# Patient Record
Sex: Male | Born: 1965 | Race: Asian | Hispanic: No | Marital: Married | Smoking: Never smoker
Health system: Southern US, Community
[De-identification: ages and names within clinical notes are randomized; demographics above are authoritative.]

## PROBLEM LIST (undated history)

## (undated) DIAGNOSIS — Z87442 Personal history of urinary calculi: Secondary | ICD-10-CM

## (undated) DIAGNOSIS — I10 Essential (primary) hypertension: Secondary | ICD-10-CM

## (undated) HISTORY — PX: VASECTOMY: SHX75

## (undated) HISTORY — DX: Essential (primary) hypertension: I10

## (undated) HISTORY — DX: Personal history of urinary calculi: Z87.442

---

## 2000-01-18 ENCOUNTER — Other Ambulatory Visit: Admission: RE | Admit: 2000-01-18 | Discharge: 2000-01-18 | Payer: Self-pay | Admitting: Urology

## 2001-02-28 ENCOUNTER — Encounter: Payer: Self-pay | Admitting: Emergency Medicine

## 2001-02-28 ENCOUNTER — Emergency Department (HOSPITAL_COMMUNITY): Admission: EM | Admit: 2001-02-28 | Discharge: 2001-03-01 | Payer: Self-pay | Admitting: Emergency Medicine

## 2006-03-13 ENCOUNTER — Ambulatory Visit: Payer: Self-pay | Admitting: Internal Medicine

## 2008-12-20 ENCOUNTER — Emergency Department (HOSPITAL_COMMUNITY): Admission: EM | Admit: 2008-12-20 | Discharge: 2008-12-21 | Payer: Self-pay | Admitting: Emergency Medicine

## 2009-08-06 IMAGING — CT CT PELVIS W/O CM
2 of 4 series · 17 of 46 positions shown, 19 images · non-contrast
Comparison: None available.

CT ABDOMEN

CLINICAL DATA: Left flank pain and hematuria.

CT ABDOMEN AND PELVIS WITHOUT CONTRAST
TECHNIQUE: Multidetector CT imaging of the abdomen and pelvis was
performed following the standard protocol without intravenous
contrast.

[Series 2: renal stone 5.0 b31f st · axial · 0.59mm/px · z∈[+684,+1064]mm · 14 of 84 slices shown, 16 images]
[im 4/84  soft-tissue]
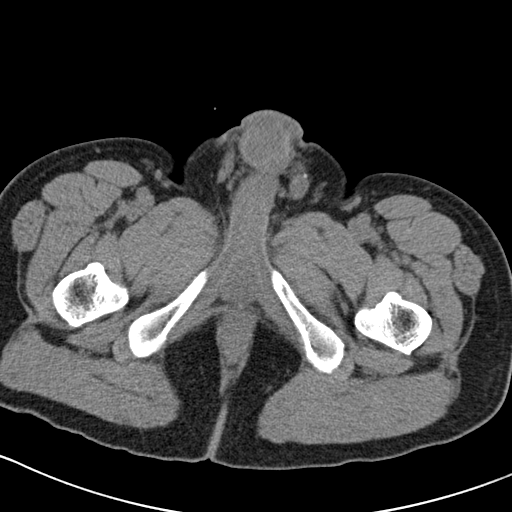
[im 4/84  bone]
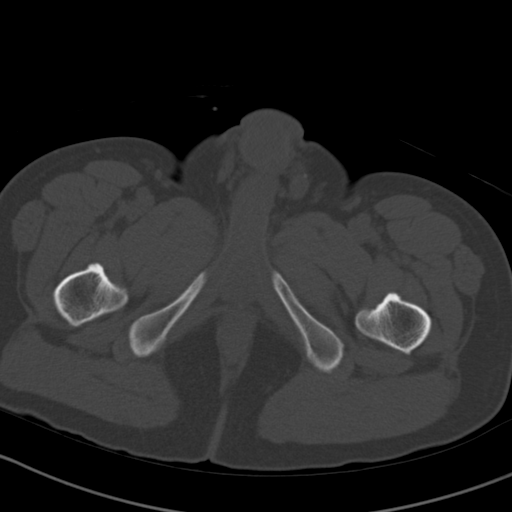
[im 11/84  soft-tissue]
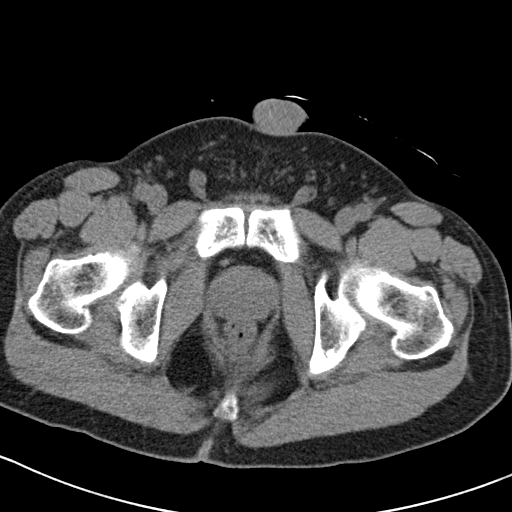
[im 18/84  soft-tissue]
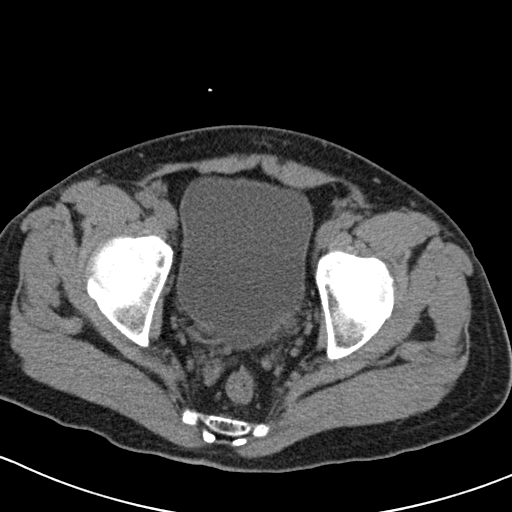
[im 21/84  soft-tissue]
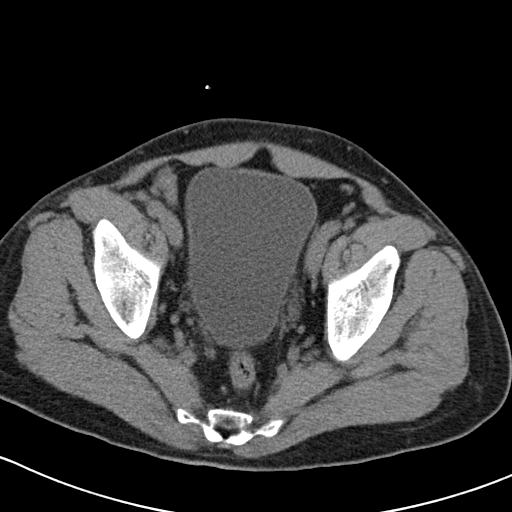
[im 28/84  soft-tissue]
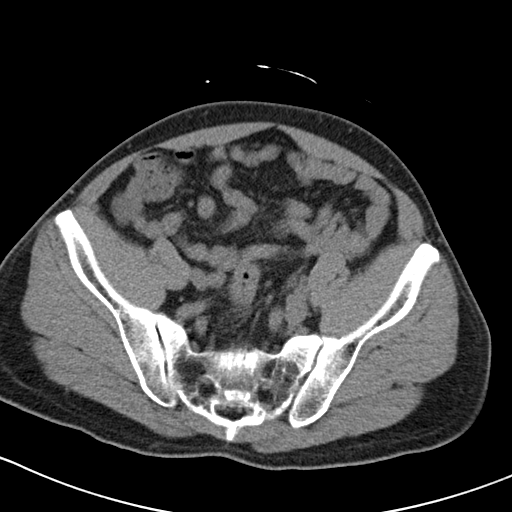
[im 35/84  soft-tissue]
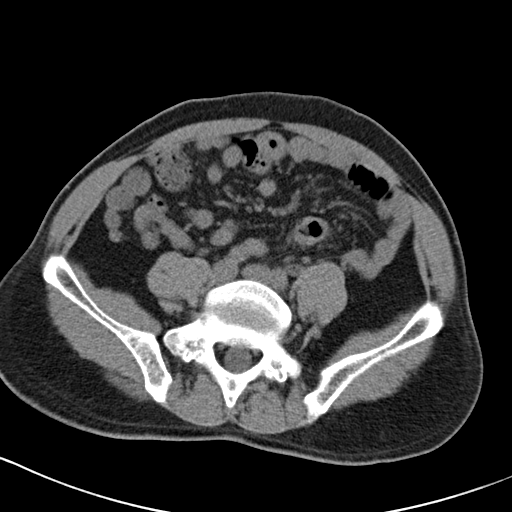
[im 39/84  soft-tissue]
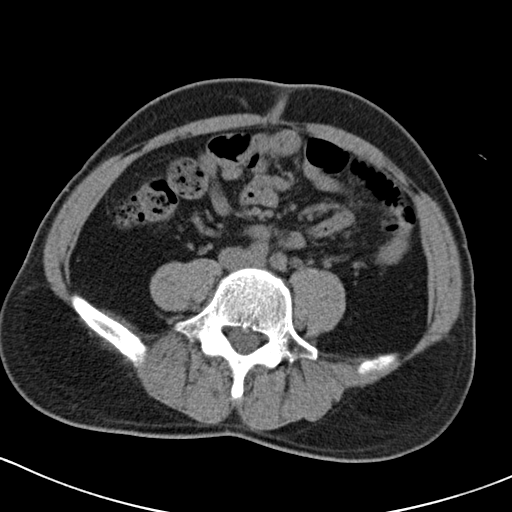
[im 45/84  soft-tissue]
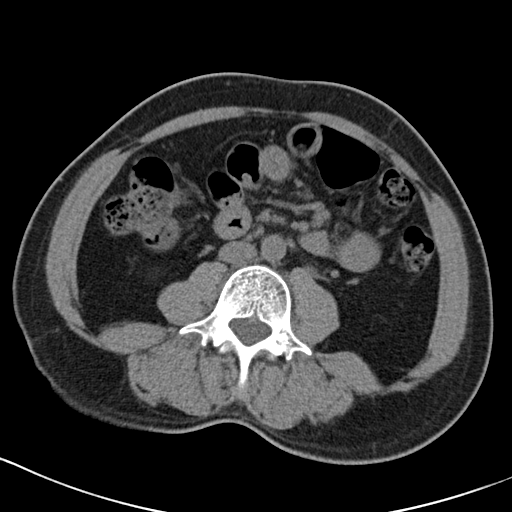
[im 49/84  soft-tissue]
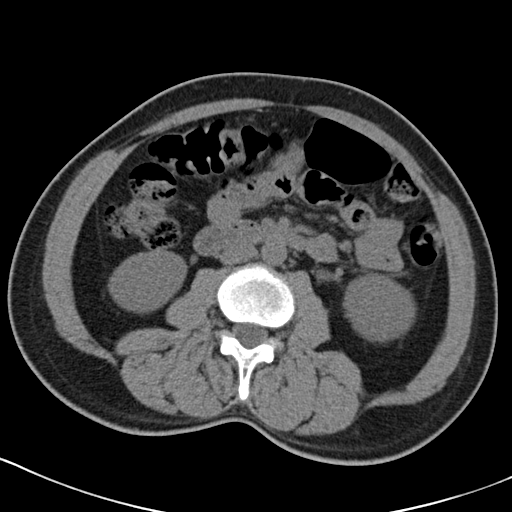
[im 49/84  bone]
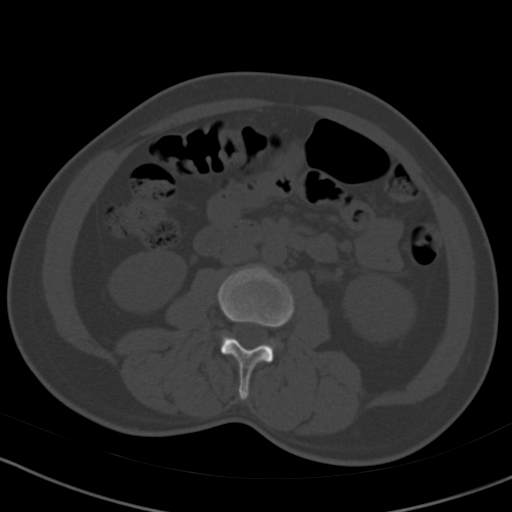
[im 56/84  soft-tissue]
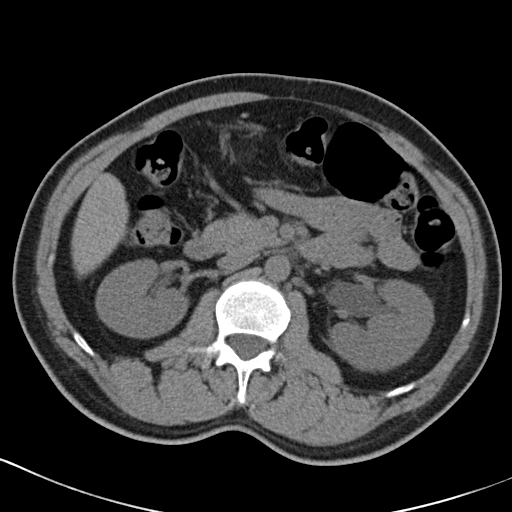
[im 63/84  soft-tissue]
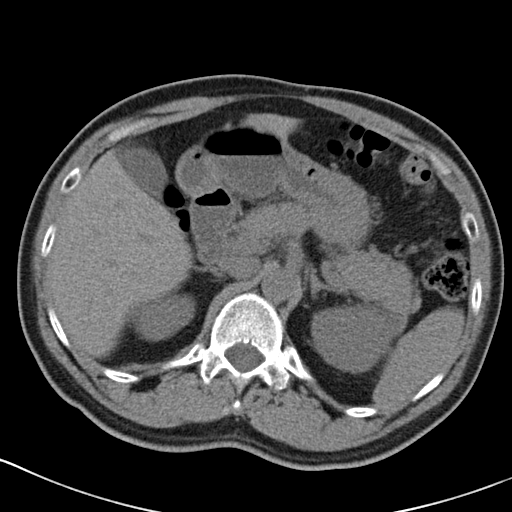
[im 66/84  soft-tissue]
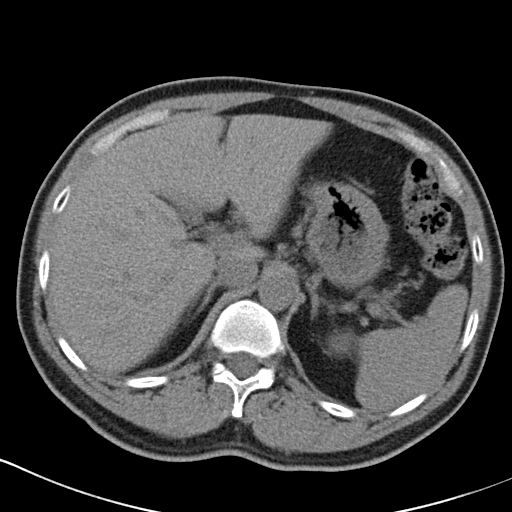
[im 73/84  soft-tissue]
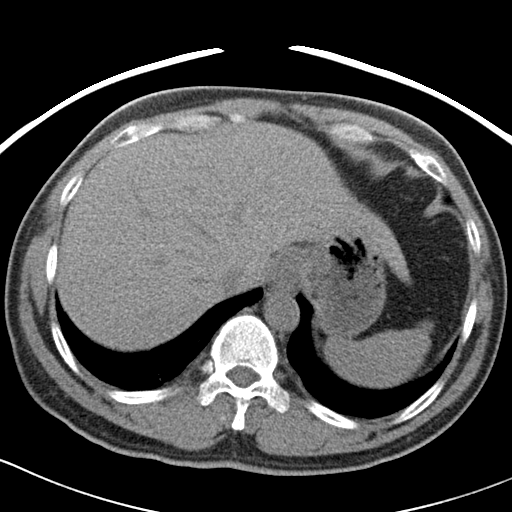
[im 80/84  soft-tissue]
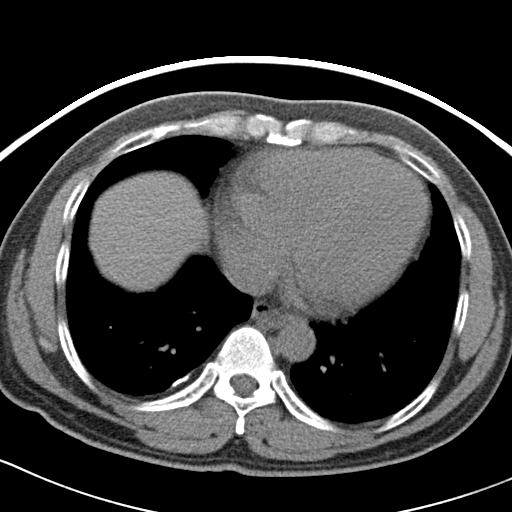

[Series 6: renal stone 2.0 spo st · coronal · 0.83mm/px · 3 of 111 slices shown]
[im 37/111  soft-tissue]
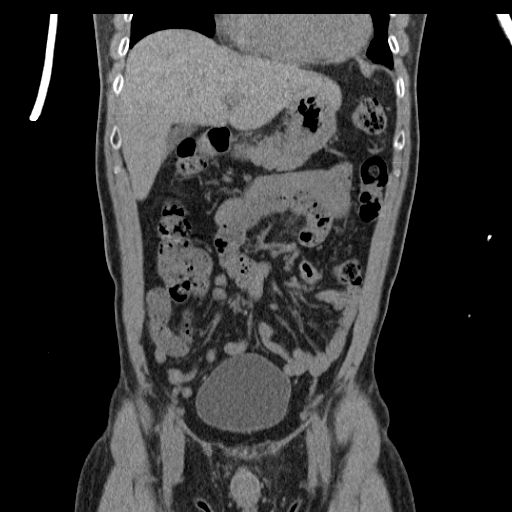
[im 49/111  soft-tissue]
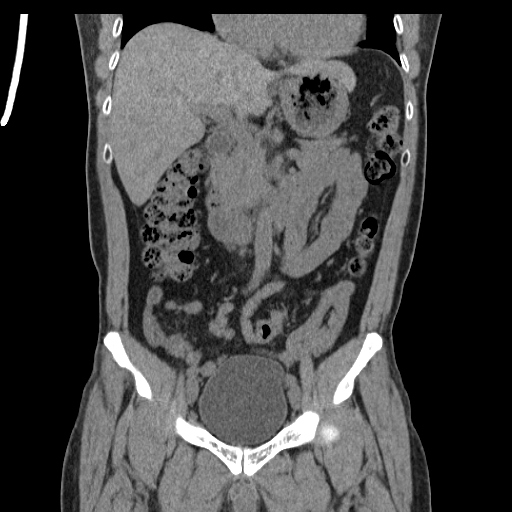
[im 62/111  soft-tissue]
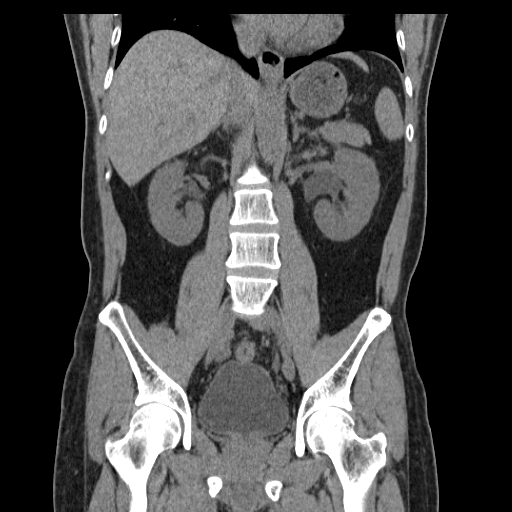

[17 of 46 positions shown; findings below may reference images not displayed]

FINDINGS: Calcified pleural plaques are seen posteriorly on both
the right and left.  There is mild dependent atelectatic change in
the lung bases. The patient has a small hiatal hernia.  Heart size
is mildly enlarged.  No pleural or pericardial effusion.

There is stranding about the left kidney and ureter and moderate
left hydronephrosis due to a 0.3 cm stone at the left
ureterovesicle junction.  A 0.2 cm nonobstructing stone is
identified the lower pole of the left kidney.  There are no right
renal or ureteral stones and the kidneys are otherwise
unremarkable.

The liver, gallbladder, spleen, pancreas and adrenal glands all
appear normal.  The stomach and small bowel appear normal. There is
no abdominal lymphadenopathy or fluid.  No focal bony abnormality.
IMPRESSION: 1.  Moderate left hydronephrosis due to 0.3 cm distal left ureteral
stone.  0.2 cm nonobstructing stone lower pole left kidney also
noted.
2.  Calcified pleural plaques compatible with prior asbestos
exposure.
3.  Small hiatal hernia.
4.  Mild cardiomegaly.

CT PELVIS
FINDINGS: There is no pelvic fluid or lymphadenopathy.  Seminal
vesicles and prostate gland appear normal.  The colon has a normal
CT appearance.  The appendix is visualized and unremarkable.  No
focal bony abnormality.
IMPRESSION: 0.3 cm left ureteral vesicle stone is again noted.  The pelvis is
otherwise negative.

## 2010-06-21 ENCOUNTER — Ambulatory Visit: Payer: Self-pay | Admitting: Internal Medicine

## 2010-06-21 DIAGNOSIS — Z87442 Personal history of urinary calculi: Secondary | ICD-10-CM

## 2010-10-08 ENCOUNTER — Telehealth: Payer: Self-pay | Admitting: Family Medicine

## 2010-10-08 ENCOUNTER — Ambulatory Visit: Payer: Self-pay | Admitting: Family Medicine

## 2010-10-08 DIAGNOSIS — I10 Essential (primary) hypertension: Secondary | ICD-10-CM

## 2010-10-08 DIAGNOSIS — R29810 Facial weakness: Secondary | ICD-10-CM | POA: Insufficient documentation

## 2010-10-08 LAB — CONVERTED CEMR LAB
ALT: 40 units/L (ref 0–53)
AST: 32 units/L (ref 0–37)
Albumin: 4.8 g/dL (ref 3.5–5.2)
Alkaline Phosphatase: 59 units/L (ref 39–117)
BUN: 20 mg/dL (ref 6–23)
Basophils Absolute: 0 10*3/uL (ref 0.0–0.1)
Basophils Relative: 0.8 % (ref 0.0–3.0)
Bilirubin, Direct: 0.1 mg/dL (ref 0.0–0.3)
CO2: 28 meq/L (ref 19–32)
Calcium: 9.1 mg/dL (ref 8.4–10.5)
Chloride: 102 meq/L (ref 96–112)
Creatinine, Ser: 0.8 mg/dL (ref 0.4–1.5)
Eosinophils Absolute: 0.2 10*3/uL (ref 0.0–0.7)
Eosinophils Relative: 3.1 % (ref 0.0–5.0)
GFR calc non Af Amer: 105.72 mL/min (ref >60)
Glucose, Bld: 98 mg/dL (ref 70–99)
HCT: 43.2 % (ref 39.0–52.0)
Hemoglobin: 14.7 g/dL (ref 13.0–17.0)
INR: 0.9 (ref 0.8–1.0)
Lymphocytes Relative: 28.3 % (ref 12.0–46.0)
Lymphs Abs: 1.7 10*3/uL (ref 0.7–4.0)
MCHC: 34 g/dL (ref 30.0–36.0)
MCV: 85.2 fL (ref 78.0–100.0)
Monocytes Absolute: 0.4 10*3/uL (ref 0.1–1.0)
Monocytes Relative: 6.1 % (ref 3.0–12.0)
Neutro Abs: 3.7 10*3/uL (ref 1.4–7.7)
Neutrophils Relative %: 61.7 % (ref 43.0–77.0)
Platelets: 252 10*3/uL (ref 150.0–400.0)
Potassium: 4.5 meq/L (ref 3.5–5.1)
Prothrombin Time: 9.9 s (ref 9.7–11.8)
RBC: 5.07 M/uL (ref 4.22–5.81)
RDW: 13.2 % (ref 11.5–14.6)
Sodium: 138 meq/L (ref 135–145)
Total Bilirubin: 0.8 mg/dL (ref 0.3–1.2)
Total Protein: 7.4 g/dL (ref 6.0–8.3)
WBC: 6.1 10*3/uL (ref 4.5–10.5)
aPTT: 27.9 s (ref 21.7–28.8)

## 2010-10-09 ENCOUNTER — Ambulatory Visit: Payer: Self-pay | Admitting: Cardiovascular Disease

## 2010-10-09 ENCOUNTER — Encounter: Payer: Self-pay | Admitting: Family Medicine

## 2010-10-10 ENCOUNTER — Encounter: Payer: Self-pay | Admitting: Family Medicine

## 2010-10-10 ENCOUNTER — Telehealth: Payer: Self-pay | Admitting: Family Medicine

## 2010-10-11 ENCOUNTER — Telehealth: Payer: Self-pay | Admitting: Family Medicine

## 2010-10-15 ENCOUNTER — Ambulatory Visit: Payer: Self-pay | Admitting: Internal Medicine

## 2010-12-23 LAB — CONVERTED CEMR LAB
ALT: 22 units/L (ref 0–53)
AST: 22 units/L (ref 0–37)
Albumin: 4.8 g/dL (ref 3.5–5.2)
Alkaline Phosphatase: 60 units/L (ref 39–117)
BUN: 15 mg/dL (ref 6–23)
Basophils Absolute: 0 10*3/uL (ref 0.0–0.1)
Basophils Relative: 0.8 % (ref 0.0–3.0)
Bilirubin Urine: NEGATIVE
Bilirubin, Direct: 0.1 mg/dL (ref 0.0–0.3)
CO2: 28 meq/L (ref 19–32)
Calcium: 8.7 mg/dL (ref 8.4–10.5)
Chloride: 101 meq/L (ref 96–112)
Cholesterol: 228 mg/dL — ABNORMAL HIGH (ref 0–200)
Creatinine, Ser: 0.8 mg/dL (ref 0.4–1.5)
Direct LDL: 154.7 mg/dL
Eosinophils Absolute: 0.1 10*3/uL (ref 0.0–0.7)
Eosinophils Relative: 2.7 % (ref 0.0–5.0)
GFR calc non Af Amer: 110.45 mL/min (ref >60)
Glucose, Bld: 86 mg/dL (ref 70–99)
Glucose, Urine, Semiquant: NEGATIVE
HCT: 43.6 % (ref 39.0–52.0)
HDL: 55.5 mg/dL (ref >39.00)
Hemoglobin: 14.8 g/dL (ref 13.0–17.0)
Ketones, urine, test strip: NEGATIVE
Lymphocytes Relative: 33.4 % (ref 12.0–46.0)
Lymphs Abs: 1.8 10*3/uL (ref 0.7–4.0)
MCHC: 33.9 g/dL (ref 30.0–36.0)
MCV: 86.2 fL (ref 78.0–100.0)
Monocytes Absolute: 0.4 10*3/uL (ref 0.1–1.0)
Monocytes Relative: 7.2 % (ref 3.0–12.0)
Neutro Abs: 3 10*3/uL (ref 1.4–7.7)
Neutrophils Relative %: 55.9 % (ref 43.0–77.0)
Nitrite: NEGATIVE
Platelets: 278 10*3/uL (ref 150.0–400.0)
Potassium: 3.9 meq/L (ref 3.5–5.1)
RBC: 5.05 M/uL (ref 4.22–5.81)
RDW: 12.7 % (ref 11.5–14.6)
Sodium: 142 meq/L (ref 135–145)
Specific Gravity, Urine: 1.02
TSH: 0.79 microintl units/mL (ref 0.35–5.50)
Total Bilirubin: 1.1 mg/dL (ref 0.3–1.2)
Total CHOL/HDL Ratio: 4
Total Protein: 7.3 g/dL (ref 6.0–8.3)
Triglycerides: 112 mg/dL (ref 0.0–149.0)
Urobilinogen, UA: 1
VLDL: 22.4 mg/dL (ref 0.0–40.0)
WBC Urine, dipstick: NEGATIVE
WBC: 5.4 10*3/uL (ref 4.5–10.5)
pH: 7

## 2010-12-25 NOTE — Assessment & Plan Note (Signed)
Summary: 1 WK ROV/NJR   Vital Signs:  Patient profile:   45 year old male Weight:      129 pounds Temp:     99.0 degrees F oral Pulse rate:   72 / minute BP sitting:   136 / 94  (left arm) Cuff size:   regular  Vitals Entered By: Alfred Levins, CMA (October 15, 2010 11:51 AM)  Serial Vital Signs/Assessments:  Time      Position  BP       Pulse  Resp  Temp     By                     130/88                         Birdie Sons MD  CC: f/u on bp   Primary Care Provider:  Birdie Sons MD  CC:  f/u on bp.  History of Present Illness: reviewed recent notes. Patient is here for follow. He denies any chest pain, shortness breath, PND. He denies any new or consistent neurologic deficits. Previous paresthesias or results. Patient remains active.  Patient denies any other complaints.  Current Problems (verified): 1)  Disturbance of Skin Sensation  (ICD-782.0) 2)  Facial Weakness  (ICD-781.94) 3)  Essential Hypertension, Benign  (ICD-401.1) 4)  Preventive Health Care  (ICD-V70.0) 5)  Nephrolithiasis, Hx of  (ICD-V13.01) 6)  Renal Calculus, Hx of  (ICD-V13.01)  Current Medications (verified): 1)  Lisinopril 10 Mg Tabs (Lisinopril) .Marland Kitchen.. 1 Tab By Mouth Qd  Allergies (verified): No Known Drug Allergies  Physical Exam  General:  well-developed well-nourished male in no acute distress. HEENT exam atraumatic, normocephalic, neck supple. Chest clear to auscultation. Cardiac exam S1-S2 are regular. Abdominal exam active bowel sounds, soft extremities no clubbing cyanosis or edema.   Impression & Recommendations:  Problem # 1:  DISTURBANCE OF SKIN SENSATION (ICD-782.0) resolving I don't think any further eval necessary IMPRESSION:   Normal CT of the head without contrast.    Read By:  Camelia Phenes,  M.D.   Released By:  Camelia Phenes,  M.D.  Additional Information  HL7 RESULT STATUS : F  External image : [id number]  External IF Update Timestamp :  2010-10-09:16:11:31.000000  Problem # 3:  ESSENTIAL HYPERTENSION, BENIGN (ICD-401.1)  His updated medication list for this problem includes:    Lisinopril 10 Mg Tabs (Lisinopril) .Marland Kitchen... 1 tab by mouth qd  BP today: 136/94 Prior BP: 140/100 (10/08/2010)  Labs Reviewed: K+: 4.5 (10/08/2010) Creat: : 0.8 (10/08/2010)   Chol: 228 (06/21/2010)   HDL: 55.50 (06/21/2010)   TG: 112.0 (06/21/2010)  Complete Medication List: 1)  Lisinopril 10 Mg Tabs (Lisinopril) .Marland Kitchen.. 1 tab by mouth qd  Patient Instructions: 1)  Please schedule a follow-up appointment in 3 months.   Orders Added: 1)  Est. Patient Level III [91478]

## 2010-12-25 NOTE — Assessment & Plan Note (Signed)
Summary: new pt/cpx/pt will come in fasting/njr   Vital Signs:  Patient profile:   45 year old male Height:      61.5 inches Weight:      127 pounds BMI:     23.69 Temp:     98.3 degrees F oral BP sitting:   108 / 80  (left arm)  Vitals Entered By: Kern Reap CMA Duncan Dull) (June 21, 2010 8:35 AM) CC: new to establish Is Patient Diabetic? No Pain Assessment Patient in pain? no        CC:  new to establish.  History of Present Illness: cpx  Preventive Screening-Counseling & Management  Alcohol-Tobacco     Smoking Status: never  Caffeine-Diet-Exercise     Caffeine use/day: 1     Does Patient Exercise: yes  Hep-HIV-STD-Contraception     Dental Visit-last 6 months yes  Safety-Violence-Falls     Seat Belt Use: yes      Drug Use:  no.    Current Problems (verified): 1)  Renal Calculus, Hx of  (ICD-V13.01)  Current Medications (verified): 1)  None  Allergies (verified): No Known Drug Allergies  Past History:  Past Medical History: Nephrolithiasis, hx of  Past Surgical History:  Vasectomy  Family History: Father: stroke age 64 yo Mother: asthma murdered Siblings: 1 sister - deceased murdered  Social History: Occupation: Geneticist, molecular (night shift) Married Never Smoked Alcohol use-yes Drug use-no Regular exercise-yes 3-4 times weekly 2 kids---healthy Does Patient Exercise:  yes Dental Care w/in 6 mos.:  yes Seat Belt Use:  yes Alcohol:  Less than 3 drinks per week Caffeine use/day:  1 Smoking Status:  never Drug Use:  no  Physical Exam  Head:  normocephalic and atraumatic.   Eyes:  pupils equal and pupils round.   Ears:  R ear normal and L ear normal.   Neck:  No deformities, masses, or tenderness noted. Chest Wall:  No deformities, masses, tenderness or gynecomastia noted. Lungs:  Normal respiratory effort, chest expands symmetrically. Lungs are clear to auscultation, no crackles or wheezes. Heart:  normal rate and regular rhythm.     Abdomen:  soft and non-tender.   Msk:  No deformity or scoliosis noted of thoracic or lumbar spine.   Pulses:  R radial normal and L radial normal.   Neurologic:  cranial nerves II-XII intact and gait normal.   Skin:  turgor normal and color normal.   Psych:  good eye contact and not anxious appearing.     Impression & Recommendations:  Problem # 1:  PREVENTIVE HEALTH CARE (ICD-V70.0) health maint UTD encouraged continued excellent helath habits Orders: EKG w/ Interpretation (93000) UA Dipstick w/o Micro (automated)  (81003) Venipuncture (25366) Specimen Handling (44034) TLB-Lipid Panel (80061-LIPID) TLB-BMP (Basic Metabolic Panel-BMET) (80048-METABOL) TLB-CBC Platelet - w/Differential (85025-CBCD) TLB-Hepatic/Liver Function Pnl (80076-HEPATIC) TLB-TSH (Thyroid Stimulating Hormone) (84443-TSH)  Laboratory Results   Urine Tests    Routine Urinalysis   Color: yellow Appearance: Clear Glucose: negative   (Normal Range: Negative) Bilirubin: negative   (Normal Range: Negative) Ketone: negative   (Normal Range: Negative) Spec. Gravity: 1.020   (Normal Range: 1.003-1.035) Blood: trace-intact   (Normal Range: Negative) pH: 7.0   (Normal Range: 5.0-8.0) Protein: 1+   (Normal Range: Negative) Urobilinogen: 1.0   (Normal Range: 0-1) Nitrite: negative   (Normal Range: Negative) Leukocyte Esterace: negative   (Normal Range: Negative)    Comments: Rita Ohara  June 21, 2010 10:51 AM

## 2010-12-25 NOTE — Assessment & Plan Note (Signed)
Summary: face numbness/bp elev/njr   Vital Signs:  Patient profile:   45 year old male Height:      61.5 inches (156.21 cm) Weight:      132 pounds (60.00 kg) O2 Sat:      99 % on Room air Temp:     98.4 degrees F (36.89 degrees C) oral Pulse rate:   64 / minute BP sitting:   140 / 100  (left arm) Cuff size:   regular  Vitals Entered By: Josph Macho RMA (October 08, 2010 9:12 AM)  O2 Flow:  Room air CC: Face numbness X6 days/ Elevated BP X2 days/ CF Is Patient Diabetic? No   History of Present Illness: 45 y/o WM with about 6d history of vague numbness sensation in right forehead area.  Hurts to raise right eyebrow, sometimes feels weaker when trying to raise right eyebrow  (?? pt not sure about this?).  He denies any droop of his face, any persistent weakness of face, dysphagia, dysarthria, or vision or hearing problems.   He denies extremity weakness or paresthesias.  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  No rash or other skin changes noted. He has no hx of elevated BP. Denies CP, SOB, palpitations, leg pains, leg swelling, fever, or malaise.  PMH: nephrolithiasis, most recently in 2010. PSH: none   Preventive Screening-Counseling & Management  Alcohol-Tobacco     Smoking Status: never  Current Medications (verified): 1)  Lisinopril 10 Mg Tabs (Lisinopril) .Marland Kitchen.. 1 Tab By Mouth Qd  Allergies (verified): No Known Drug Allergies  Past History:  Past medical, surgical, family and social histories (including risk factors) reviewed, and no changes noted (except as noted below).  Past Medical History: Reviewed history from 06/21/2010 and no changes required. Nephrolithiasis, hx of  Past Surgical History: Reviewed history from 06/21/2010 and no changes required.  Vasectomy  Review of Systems       see HPI.  Physical Exam  General:  VS:all noted: P 65-75 BP 140/100     Gen: Alert, well appearing, oriented x 4. HEENT: Scalp without lesions or hair loss.  Ears: EACs clear, normal epithelium.  TMs with good light reflex and landmarks bilaterally.  Eyes: no injection, icteris, swelling, or exudate.  EOMI, PERRLA. Nose: no drainage or turbinate edema/swelling.  No inection or focal lesion.  Mouth: lips without lesion/swelling.  Oral mucosa pink and moist.  Dentition intact and without obvious caries or gingival swelling.  Oropharynx without erythema, exudate, or swelling.  Neck: supple.  No lymphadenopathy, thyromegaly, or mass. Chest: symmetric expansion, with nonlabored respirations.  Clear and equal breath sounds in all lung fields.   CV: RRR, no m/r/g.  Peripheral pulses 2+/symmetric. EXT: no clubbing, cyanosis, or edema.  Neuro:  decreased fine touch sensation in right forehead area extending into anterior 1/2 of the scalp.  No skin changes here.  Otherwise CN 2-12 grossly intact bilaterally.  PERRLA. Strength 5/5 prox and dist in UE's and LE's bilat. No tremor, no disdiadokinesis, no ataxia.  DTRs 1+, symmetric in triceps, biceps, patellar, and achilles areas bilaterally.     Impression & Recommendations:  Problem # 1:  DISTURBANCE OF SKIN SENSATION (ICD-782.0) This most clearly fits the distribution of the opthalmic branch of the trigeminal nerve, and would expect this to be part of varicella zoster syndrome.  However, with pt unsure about intermittent eyebrow weakness PLUS no sign of  any rash at this time, I am not completely certain that a central CNS lesion is not present.  Will go ahead and obtain noncontrast head CT ASAP. Additionally, will check CBC, CMET, coag panel.  Problem # 2:  ESSENTIAL HYPERTENSION, BENIGN (ICD-401.1) Given the height of his bp measurements at work last night and current elevation today, I feel he needs to be started on antihypertensive now so will start lisinopril 10mg  once daily.   Recheck 1 wk.  Orders: Venipuncture  (16109) Specimen Handling (60454) TLB-BMP (Basic Metabolic Panel-BMET) (80048-METABOL) TLB-CBC Platelet - w/Differential (85025-CBCD) TLB-Hepatic/Liver Function Pnl (80076-HEPATIC) Radiology Referral (Radiology)  Complete Medication List: 1)  Lisinopril 10 Mg Tabs (Lisinopril) .Marland Kitchen.. 1 tab by mouth qd  Other Orders: TLB-PT (Protime) (85610-PTP) TLB-PTT (85730-PTTL)  Patient Instructions: 1)  Please schedule a follow-up appointment in 1 week.  Prescriptions: LISINOPRIL 10 MG TABS (LISINOPRIL) 1 tab by mouth qd  #30 x 0   Entered and Authorized by:   Michell Heinrich M.D.   Signed by:   Michell Heinrich M.D. on 10/08/2010   Method used:   Print then Give to Patient   RxID:   [id number]    Orders Added: 1)  Est. Patient Level IV [30865] 2)  Venipuncture [78469] 3)  Specimen Handling [99000] 4)  TLB-BMP (Basic Metabolic Panel-BMET) [80048-METABOL] 5)  TLB-CBC Platelet - w/Differential [85025-CBCD] 6)  TLB-Hepatic/Liver Function Pnl [80076-HEPATIC] 7)  TLB-PT (Protime) [85610-PTP] 8)  TLB-PTT [85730-PTTL] 9)  Radiology Referral [Radiology]

## 2010-12-25 NOTE — Letter (Signed)
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         Sincerely,    Josph Macho RMA

## 2010-12-25 NOTE — Progress Notes (Signed)
Summary: CT resuts?  Phone Note Call from Patient Call back at Home Phone [phone]   Caller: Patient Call For: Dr. Milinda Cave Summary of Call: Pt is calling for CT results. Advised that I dont' see it back in the chart yet.  Initial call taken by: [facility] CMA AAMA,  October 10, 2010 3:59 PM  Follow-up for Phone Call        I don't see it either.  Should be back any time, then we'll let him know. Follow-up by: Michell Heinrich M.D.,  October 11, 2010 8:13 AM

## 2010-12-25 NOTE — Progress Notes (Signed)
Summary: Work excuse?  Phone Note Other Incoming   Summary of Call: Please call pt and tell him all his blood tests came back normal. Initial call taken by: Michell Heinrich M.D.,  October 08, 2010 2:28 PM  Follow-up for Phone Call        I left a message for pt to return my call. Follow-up by: Josph Macho RMA,  October 08, 2010 2:32 PM  Additional Follow-up for Phone Call Additional follow up Details #1::        pt is rt christy call [phone] Additional Follow-up by: Heron Sabins,  October 08, 2010 3:55 PM    Additional Follow-up for Phone Call Additional follow up Details #2::    Patient informed. Pt would like to know if he can have a work excuse for [employer] and tomorrow night. Follow-up by: Josph Macho RMA,  October 08, 2010 4:03 PM  Additional Follow-up for Phone Call Additional follow up Details #3:: Details for Additional Follow-up Action Taken: That's fine Additional Follow-up by: Michell Heinrich M.D.,  October 08, 2010 4:06 PM

## 2010-12-25 NOTE — Progress Notes (Signed)
Summary: CT results  Phone Note Call from Patient Call back at cell [phone]   Caller: Patient Call For: Milinda Cave, MD Loistine Chance Summary of Call: results of CT done on 11/15. Initial call taken by: Gladis Riffle, RN,  October 11, 2010 3:54 PM  Follow-up for Phone Call        Results still not in chart.  Patient notified we are still waiting on them.  Will call to check on results and then call pt. Follow-up by: Gladis Riffle, RN,  October 11, 2010 3:55 PM  Additional Follow-up for Phone Call Additional follow up Details #1::        Please notify him that his CT scan was normal. If his symptoms are still unchanged, the next step is to have a neurologist see him.  If he wants to do this then let me know.  If he asks for an MRI, tell him I recommend he see the neurologist first and let him/her decide on what tests to do, if any.  Thanks Additional Follow-up by: Michell Heinrich M.D.,  October 11, 2010 4:08 PM    Additional Follow-up for Phone Call Additional follow up Details #2::    Pt does want to see neurologist.  He will await when and where of appt. Follow-up by: Gladis Riffle, RN,  October 11, 2010 4:23 PM  Noted.  Will make referral to [facility]

## 2011-03-11 LAB — URINALYSIS, ROUTINE W REFLEX MICROSCOPIC
Glucose, UA: NEGATIVE mg/dL
Leukocytes, UA: NEGATIVE
Protein, ur: NEGATIVE mg/dL
Specific Gravity, Urine: 1.004 — ABNORMAL LOW (ref 1.005–1.030)
pH: 6.5 (ref 5.0–8.0)

## 2011-03-11 LAB — URINE MICROSCOPIC-ADD ON

## 2011-05-26 IMAGING — CT CT HEAD W/O CM
1 series · 16 of 30 positions shown, 20 images · non-contrast
Comparison: None.

CLINICAL DATA: Facial weakness.  Numbness left head

CT HEAD WITHOUT CONTRAST
TECHNIQUE: Contiguous axial images were obtained from the base of
the skull through the vertex without contrast

[Series 2: head_seq -c 4.5 h37s st · axial · 0.43mm/px · z∈[+1238,+1368]mm · 16 of 32 slices shown, 20 images]
[im 2/32  brain]
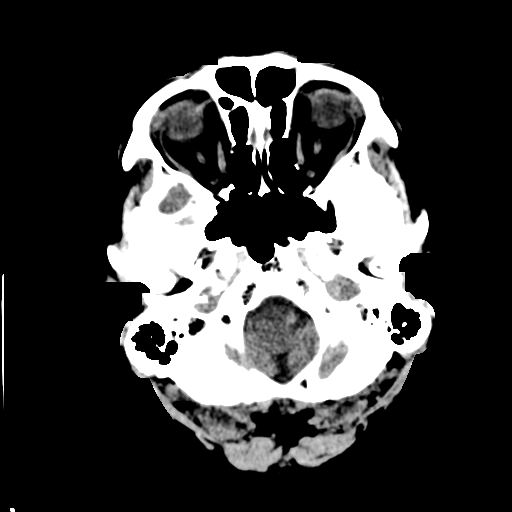
[im 2/32  bone]
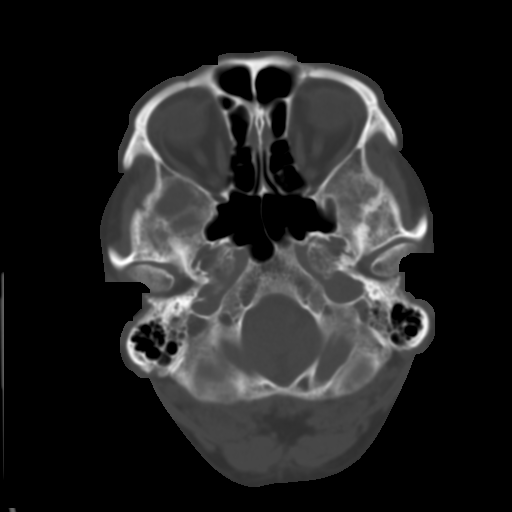
[im 4/32  brain]
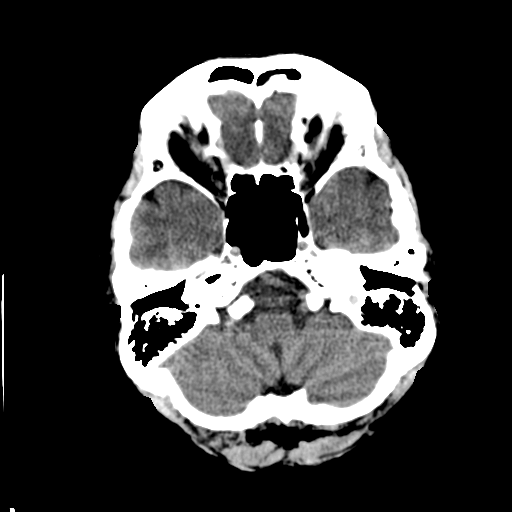
[im 6/32  brain]
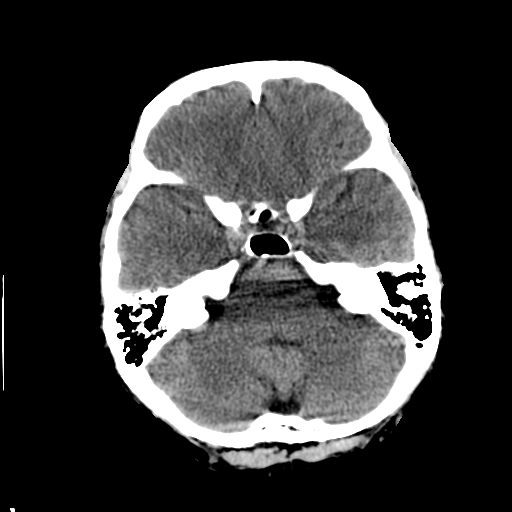
[im 8/32  brain]
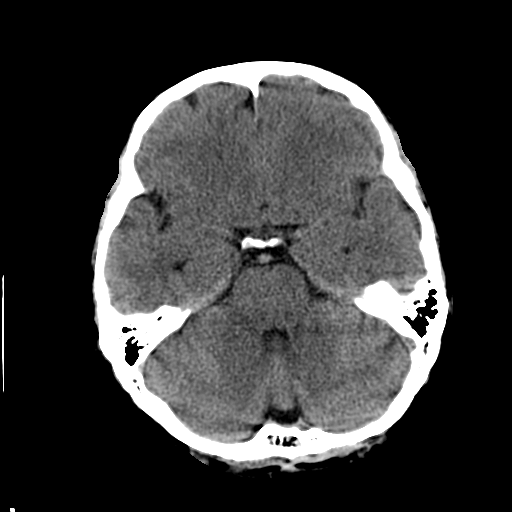
[im 9/32  brain]
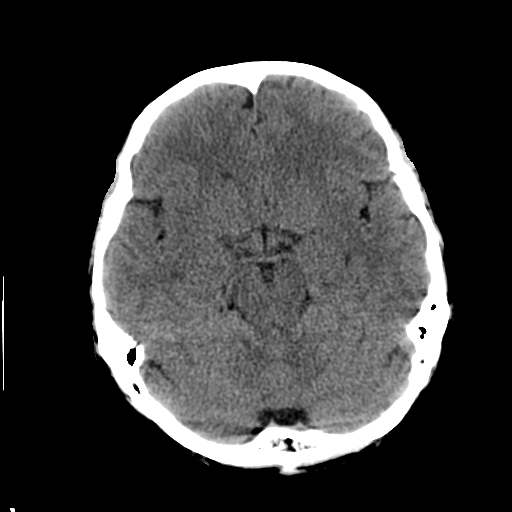
[im 9/32  bone]
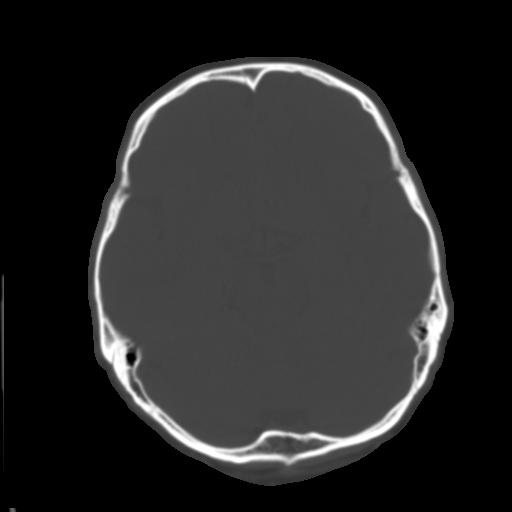
[im 11/32  brain]
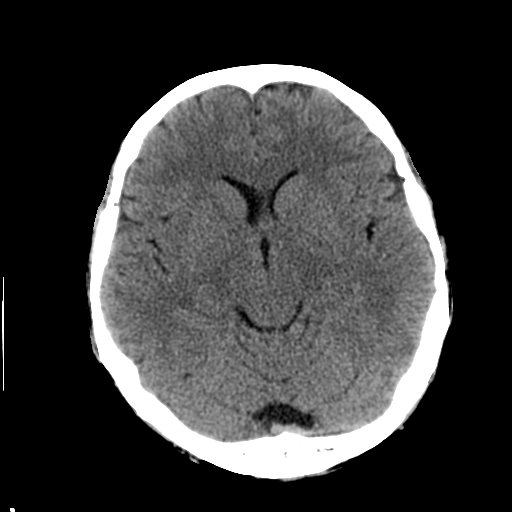
[im 13/32  brain]
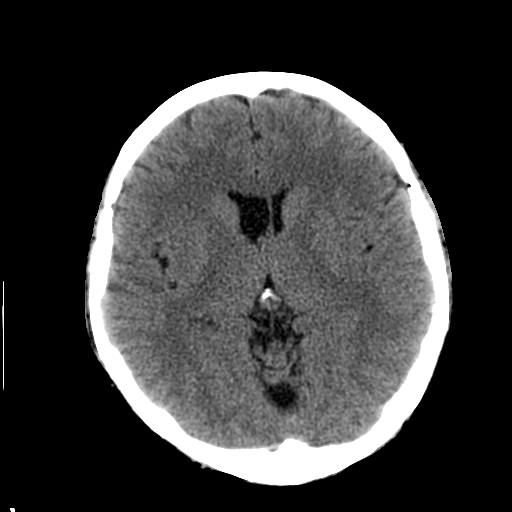
[im 15/32  brain]
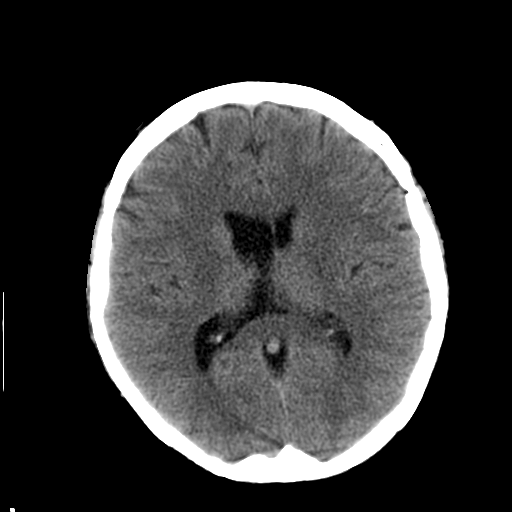
[im 17/32  brain]
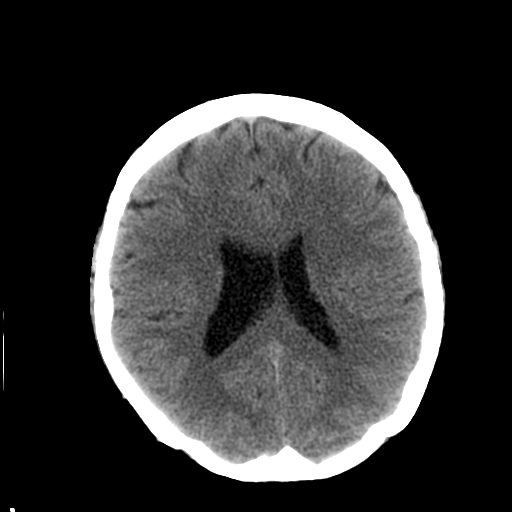
[im 17/32  bone]
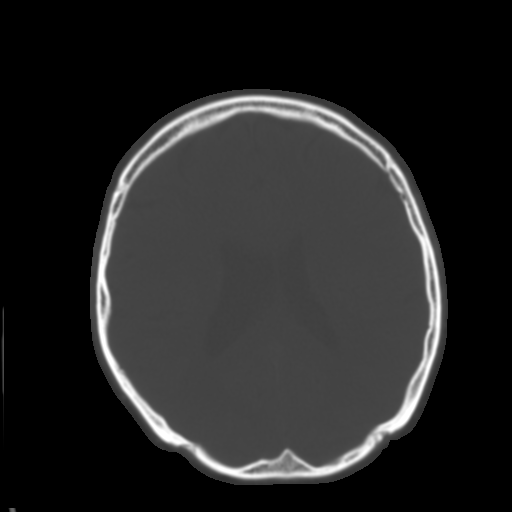
[im 19/32  brain]
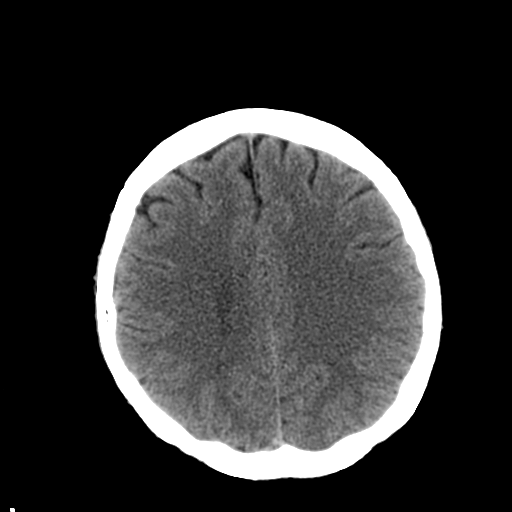
[im 21/32  brain]
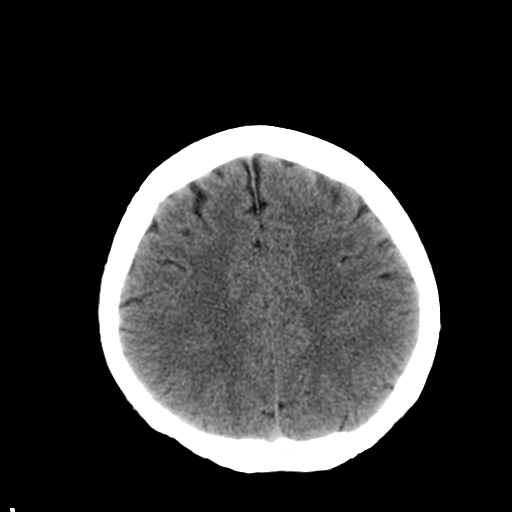
[im 23/32  brain]
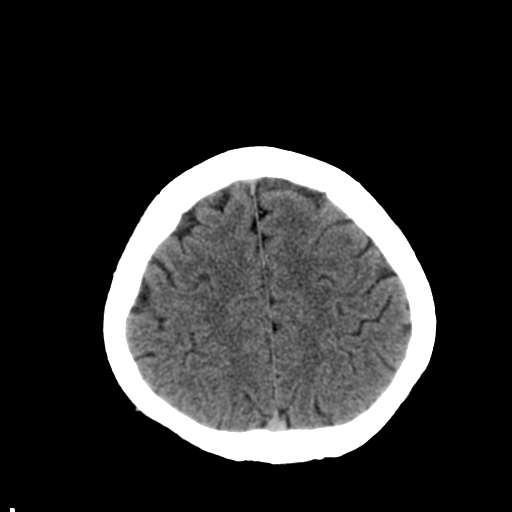
[im 24/32  brain]
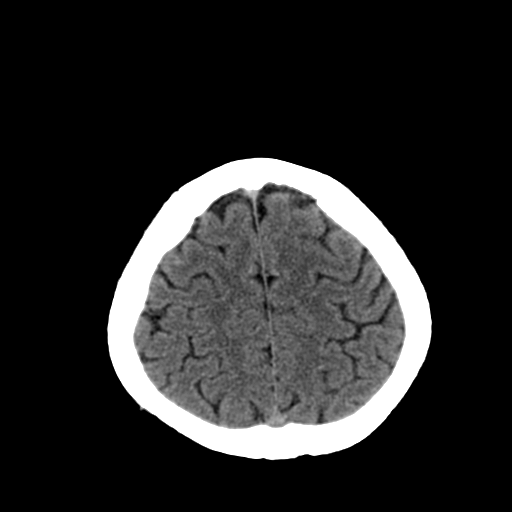
[im 24/32  bone]
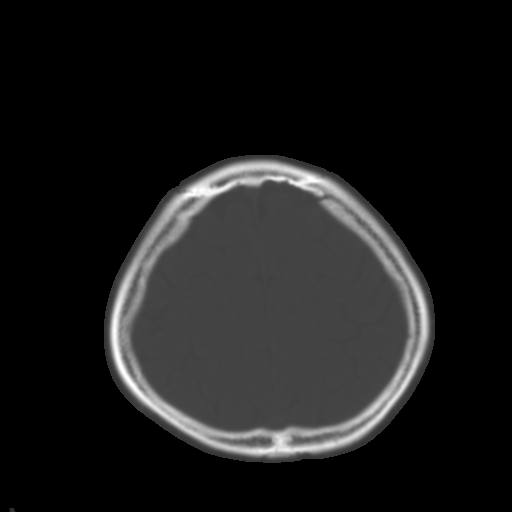
[im 26/32  brain]
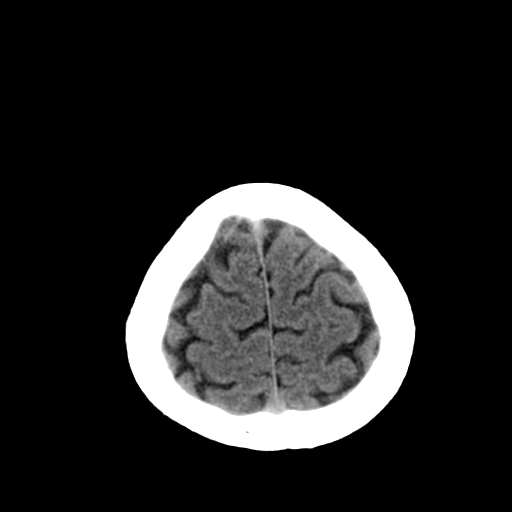
[im 28/32  brain]
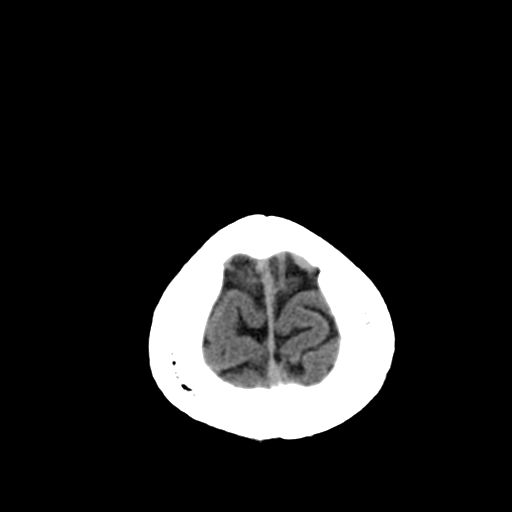
[im 30/32  brain]
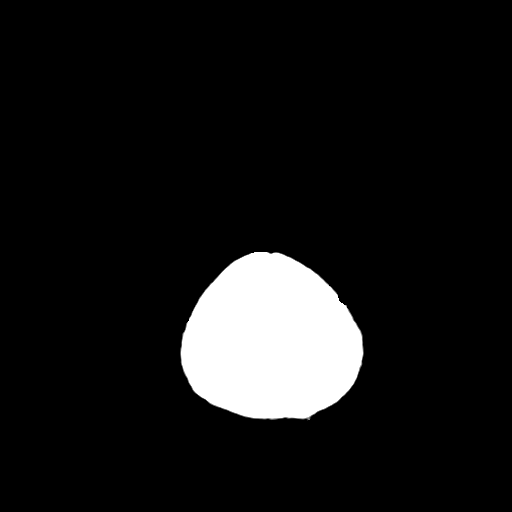

[16 of 30 positions shown; findings below may reference images not displayed]

FINDINGS: The brain has a normal appearance without evidence for
hemorrhage, acute infarction, hydrocephalus, or mass lesion.  There
is no extra axial fluid collection.  The skull and paranasal
sinuses are normal.
IMPRESSION: Normal CT of the head without contrast.

## 2011-12-02 ENCOUNTER — Ambulatory Visit (INDEPENDENT_AMBULATORY_CARE_PROVIDER_SITE_OTHER): Payer: 59 | Admitting: Internal Medicine

## 2011-12-02 DIAGNOSIS — Z23 Encounter for immunization: Secondary | ICD-10-CM

## 2011-12-16 ENCOUNTER — Other Ambulatory Visit: Payer: Self-pay | Admitting: *Deleted

## 2011-12-16 MED ORDER — LISINOPRIL 10 MG PO TABS: 10.0000 mg | ORAL_TABLET | Freq: Every day | ORAL | Status: DC

## 2012-03-02 ENCOUNTER — Ambulatory Visit (INDEPENDENT_AMBULATORY_CARE_PROVIDER_SITE_OTHER): Payer: 59 | Admitting: Family

## 2012-03-02 VITALS — BP 132/86 | Temp 98.4°F | Wt 134.0 lb

## 2012-03-02 DIAGNOSIS — B029 Zoster without complications: Secondary | ICD-10-CM

## 2012-03-02 DIAGNOSIS — IMO0002 Reserved for concepts with insufficient information to code with codable children: Secondary | ICD-10-CM

## 2012-03-02 DIAGNOSIS — M792 Neuralgia and neuritis, unspecified: Secondary | ICD-10-CM

## 2012-03-02 MED ORDER — VALACYCLOVIR HCL 1 G PO TABS: 1000.0000 mg | ORAL_TABLET | Freq: Three times a day (TID) | ORAL | Status: DC

## 2012-03-02 MED ORDER — HYDROCODONE-ACETAMINOPHEN 5-500 MG PO TABS: 1.0000 | ORAL_TABLET | Freq: Three times a day (TID) | ORAL | Status: AC | PRN

## 2012-03-02 NOTE — Patient Instructions (Signed)

## 2012-03-02 NOTE — Progress Notes (Signed)
  Subjective:    Patient ID: Jeffrey Garcia, male    DOB: [birth year], 46 y.o.   MRN: [id number]  HPI Comments: 46 yo male presents with c/o dull back discomfort started Friday ranked as 5/10. Pain increased in intensity and linear red rash formed on back, ranked as 10/10 described as intense and constant. "Nothing makes the pain better or worse. Has not taken any OTC medications.      Review of Systems  Eyes: Positive for redness.  Respiratory: Negative.   Cardiovascular: Negative.   Skin: Positive for rash. Negative for color change and wound.   No past medical history on file.  History   Social History  . Marital Status: Married    Spouse Name: N/A    Number of Children: N/A  . Years of Education: N/A   Occupational History  . Not on file.   Social History Main Topics  . Smoking status: Not on file  . Smokeless tobacco: Not on file  . Alcohol Use: Not on file  . Drug Use: Not on file  . Sexually Active: Not on file   Other Topics Concern  . Not on file   Social History Narrative  . No narrative on file    No past surgical history on file.  No family history on file.  No Known Allergies  Current Outpatient Prescriptions on File Prior to Visit  Medication Sig Dispense Refill  . lisinopril (PRINIVIL,ZESTRIL) 10 MG tablet Take 1 tablet (10 mg total) by mouth daily.  30 tablet  0    BP 132/86  Temp(Src) 98.4 F (36.9 C) (Oral)  Wt 134 lb (60.782 kg)chart    Objective:   Physical Exam  Constitutional: He is oriented to person, place, and time. He appears well-developed and well-nourished. No distress.  Cardiovascular: Normal rate, regular rhythm and intact distal pulses.  Exam reveals no gallop.   No murmur heard. Pulmonary/Chest: Effort normal and breath sounds normal. No respiratory distress. He has no wheezes. He has no rales. He exhibits no tenderness.  Neurological: He is alert and oriented to person, place, and time.  Skin: Skin is warm and dry. Rash  noted. He is not diaphoretic. There is erythema. No pallor.             Assessment & Plan:  Assessment: Shingles Plan: Valtrex, vicodin prn pain, teaching handouts provided on diagnosis and treatment, Encouraged to RTC if s/s get wrose

## 2012-03-03 ENCOUNTER — Telehealth: Payer: Self-pay | Admitting: Family Medicine

## 2012-03-03 NOTE — Telephone Encounter (Signed)
Pt is calling back requesting callback today

## 2012-03-03 NOTE — Telephone Encounter (Signed)
Pulled from Triage vmail. Pt saw Padonda yesterday and was Dx with shingles. Per pt, he works in a hospital. He wants to know if he's contagious, how likely it is he can give it to someone. I think he's trying to see if he can work. Please call to advise.

## 2012-03-03 NOTE — Telephone Encounter (Signed)
Pt aware, Per Padonda, as long as he does not have direct contact with pts then he is ok to work.   Pt now asks if his pain is too severe for him to work, will he be able to get a work excuse.  Pt states that he thinks he is ok but will call back if he need one

## 2012-03-04 ENCOUNTER — Telehealth: Payer: Self-pay | Admitting: Internal Medicine

## 2012-03-04 NOTE — Telephone Encounter (Signed)
Letter printed.

## 2012-03-04 NOTE — Telephone Encounter (Signed)
Pt requesting a doctor note for today and tomorrow. Please contact when ready to pick up

## 2012-03-17 ENCOUNTER — Telehealth: Payer: Self-pay | Admitting: Family

## 2012-03-17 NOTE — Telephone Encounter (Signed)
Note printed and pt to pick up on 03/18/2012

## 2012-03-17 NOTE — Telephone Encounter (Signed)
Patient is in need of a note for work April 14,15, and 16th.  The original note given to him was for April 10 & 11.  The pain was very bad in his back and he stated his supervisor sent him home.  Please call patient when ready.

## 2012-03-18 DIAGNOSIS — Z0279 Encounter for issue of other medical certificate: Secondary | ICD-10-CM

## 2012-03-23 ENCOUNTER — Telehealth: Payer: Self-pay | Admitting: Family Medicine

## 2012-03-23 NOTE — Telephone Encounter (Signed)
Pt called and is still having some back pain due to having shingles. He wants to know about how long it takes to clear up, ( was just seen here in April 2013 ) He did take the pain medication, however he can't take while at work. He works at night and it makes him sleepy and right now he is taking tylenol but in pain.

## 2012-03-24 MED ORDER — GABAPENTIN 300 MG PO CAPS: 300.0000 mg | ORAL_CAPSULE | Freq: Three times a day (TID) | ORAL | Status: DC

## 2012-03-24 NOTE — Telephone Encounter (Signed)
Left message on machine for pt to return call to [facility]

## 2012-03-27 ENCOUNTER — Encounter: Payer: Self-pay | Admitting: Internal Medicine

## 2012-03-27 ENCOUNTER — Ambulatory Visit (INDEPENDENT_AMBULATORY_CARE_PROVIDER_SITE_OTHER): Payer: 59 | Admitting: Internal Medicine

## 2012-03-27 VITALS — BP 116/80 | Temp 98.3°F | Ht 62.0 in | Wt 134.0 lb

## 2012-03-27 DIAGNOSIS — B0229 Other postherpetic nervous system involvement: Secondary | ICD-10-CM

## 2012-03-27 MED ORDER — LIDOCAINE 5 % EX PTCH: 1.0000 | MEDICATED_PATCH | CUTANEOUS | Status: AC

## 2012-03-29 DIAGNOSIS — B0229 Other postherpetic nervous system involvement: Secondary | ICD-10-CM | POA: Insufficient documentation

## 2012-03-29 NOTE — Progress Notes (Signed)
Patient ID: Jeffrey Garcia, male   DOB: [birth year], 46 y.o.   MRN: [id number] Continued zoster pain--- now for one month  Past Medical History  Diagnosis Date  . Hypertension   . History of nephrolithiasis     History   Social History  . Marital Status: Married    Spouse Name: N/A    Number of Children: N/A  . Years of Education: N/A   Occupational History  . Not on file.   Social History Main Topics  . Smoking status: Never Smoker   . Smokeless tobacco: Not on file  . Alcohol Use: Yes  . Drug Use: No  . Sexually Active: Not on file   Other Topics Concern  . Not on file   Social History Narrative  . No narrative on file    Past Surgical History  Procedure Date  . Vasectomy     Family History  Problem Relation Age of Onset  . Stroke Father     No Known Allergies  Current Outpatient Prescriptions on File Prior to Visit  Medication Sig Dispense Refill  . lisinopril (PRINIVIL,ZESTRIL) 10 MG tablet Take 1 tablet (10 mg total) by mouth daily.  30 tablet  0     patient denies chest pain, shortness of breath, orthopnea. Denies lower extremity edema, abdominal pain, change in appetite, change in bowel movements. Patient denies rashes, musculoskeletal complaints. No other specific complaints in a complete review of systems.   BP 116/80  Temp(Src) 98.3 F (36.8 C) (Oral)  Ht 5\' 2"  (1.575 m)  Wt 134 lb (60.782 kg)  BMI 24.51 kg/m2  well-developed well-nourished male in no acute distress. HEENT exam atraumatic, normocephalic, neck supple without jugular venous distention. Chest clear to auscultation cardiac exam S1-S2 are regular. Abdominal exam overweight with bowel sounds, soft and nontender.  Dermatomal scarring pattern---right thoracic level

## 2012-03-29 NOTE — Assessment & Plan Note (Signed)
Needs better treatment Discussed variable natural history lidoderm patch vicodin neurontin if necessary

## 2012-03-30 ENCOUNTER — Other Ambulatory Visit: Payer: Self-pay | Admitting: *Deleted

## 2012-03-30 MED ORDER — LISINOPRIL 10 MG PO TABS: 10.0000 mg | ORAL_TABLET | Freq: Every day | ORAL | Status: DC

## 2012-11-11 ENCOUNTER — Other Ambulatory Visit: Payer: Self-pay | Admitting: Internal Medicine

## 2013-02-22 ENCOUNTER — Other Ambulatory Visit: Payer: Self-pay | Admitting: *Deleted

## 2013-02-22 MED ORDER — LISINOPRIL 10 MG PO TABS: ORAL_TABLET | ORAL | Status: DC

## 2013-06-07 ENCOUNTER — Other Ambulatory Visit: Payer: Self-pay | Admitting: Internal Medicine

## 2013-06-14 ENCOUNTER — Telehealth: Payer: Self-pay | Admitting: Internal Medicine

## 2013-06-14 MED ORDER — LISINOPRIL 10 MG PO TABS: ORAL_TABLET | ORAL | Status: DC

## 2013-06-14 NOTE — Telephone Encounter (Signed)
Pt aware he needs appt for refill, appt set w/ pandonda mon 7/28. Will you refill for 30 days until then? Pharm: [facility]

## 2013-06-14 NOTE — Telephone Encounter (Signed)
rx was for lisinopril, 30 day supply sent in electronically to [facility]

## 2013-06-21 ENCOUNTER — Ambulatory Visit: Payer: 59 | Admitting: Family

## 2013-06-25 ENCOUNTER — Encounter: Payer: Self-pay | Admitting: Family

## 2013-06-25 ENCOUNTER — Ambulatory Visit: Payer: 59

## 2013-06-25 ENCOUNTER — Ambulatory Visit (INDEPENDENT_AMBULATORY_CARE_PROVIDER_SITE_OTHER): Payer: 59 | Admitting: Family

## 2013-06-25 VITALS — BP 124/80 | HR 60 | Wt 132.0 lb

## 2013-06-25 DIAGNOSIS — I10 Essential (primary) hypertension: Secondary | ICD-10-CM

## 2013-06-25 DIAGNOSIS — Z79899 Other long term (current) drug therapy: Secondary | ICD-10-CM

## 2013-06-25 LAB — COMPREHENSIVE METABOLIC PANEL
AST: 292 U/L — ABNORMAL HIGH (ref 0–37)
Albumin: 4.3 g/dL (ref 3.5–5.2)
Alkaline Phosphatase: 42 U/L (ref 39–117)
Calcium: 9.2 mg/dL (ref 8.4–10.5)
Chloride: 105 mEq/L (ref 96–112)
Glucose, Bld: 89 mg/dL (ref 70–99)
Potassium: 4.4 mEq/L (ref 3.5–5.1)
Sodium: 140 mEq/L (ref 135–145)
Total Protein: 6.6 g/dL (ref 6.0–8.3)

## 2013-06-25 LAB — HEPATIC FUNCTION PANEL
Albumin: 4.3 g/dL (ref 3.5–5.2)
Alkaline Phosphatase: 45 U/L (ref 39–117)
Bilirubin, Direct: 0.1 mg/dL (ref 0.0–0.3)
Total Bilirubin: 0.9 mg/dL (ref 0.3–1.2)

## 2013-06-25 LAB — LIPID PANEL
Cholesterol: 216 mg/dL — ABNORMAL HIGH (ref 0–200)
Total CHOL/HDL Ratio: 4

## 2013-06-25 LAB — CBC WITH DIFFERENTIAL/PLATELET
Eosinophils Relative: 4.2 % (ref 0.0–5.0)
HCT: 40.4 % (ref 39.0–52.0)
Hemoglobin: 13.6 g/dL (ref 13.0–17.0)
Lymphs Abs: 1.4 10*3/uL (ref 0.7–4.0)
MCV: 85.9 fl (ref 78.0–100.0)
Monocytes Relative: 7.5 % (ref 3.0–12.0)
Neutro Abs: 3.3 10*3/uL (ref 1.4–7.7)
WBC: 5.4 10*3/uL (ref 4.5–10.5)

## 2013-06-25 NOTE — Addendum Note (Signed)
Addended by: Bonnye Fava on: 06/25/2013 11:14 AM   Modules accepted: Orders

## 2013-06-25 NOTE — Progress Notes (Signed)
  Subjective:    Patient ID: Jeffrey Garcia, male    DOB: 1966-03-26, 46 y.o.   MRN: [id number]  HPI 47 year old male, patient of Dr. Cato Mulligan is in today for recheck of hypertension. Denies any concerns. He exercises 3-5 times per week, cycling. Overall the great physical condition. He is requesting a have his fasting labs drawn today return for a complete physical soon.   Review of Systems  Constitutional: Negative.   HENT: Negative.   Respiratory: Negative.   Cardiovascular: Negative.   Gastrointestinal: Negative.   Endocrine: Negative.   Genitourinary: Negative.   Musculoskeletal: Negative.   Skin: Negative.   Neurological: Negative.   Hematological: Negative.   Psychiatric/Behavioral: Negative.    Past Medical History  Diagnosis Date  . Hypertension   . History of nephrolithiasis     History   Social History  . Marital Status: Married    Spouse Name: N/A    Number of Children: N/A  . Years of Education: N/A   Occupational History  . Not on file.   Social History Main Topics  . Smoking status: Never Smoker   . Smokeless tobacco: Not on file  . Alcohol Use: Yes  . Drug Use: No  . Sexually Active: Not on file   Other Topics Concern  . Not on file   Social History Narrative  . No narrative on file    Past Surgical History  Procedure Laterality Date  . Vasectomy      Family History  Problem Relation Age of Onset  . Stroke Father     No Known Allergies  Current Outpatient Prescriptions on File Prior to Visit  Medication Sig Dispense Refill  . lisinopril (PRINIVIL,ZESTRIL) 10 MG tablet TAKE 1 TABLET BY MOUTH ONCE DAILY NEEDS OV  30 tablet  0   No current facility-administered medications on file prior to visit.    BP 124/80  Pulse 60  Wt 132 lb (59.875 kg)  BMI 24.14 kg/m2chart    Objective:   Physical Exam  Constitutional: He is oriented to person, place, and time. He appears well-developed and well-nourished.  HENT:  Right Ear: External ear  normal.  Left Ear: External ear normal.  Nose: Nose normal.  Mouth/Throat: Oropharynx is clear and moist.  Neck: Normal range of motion. Neck supple. No thyromegaly present.  Cardiovascular: Normal rate, regular rhythm and normal heart sounds.   130/80  Pulmonary/Chest: Effort normal and breath sounds normal.  Abdominal: Soft. Bowel sounds are normal.  Musculoskeletal: Normal range of motion.  Neurological: He is alert and oriented to person, place, and time.  Skin: Skin is warm and dry.  Psychiatric: He has a normal mood and affect.          Assessment & Plan:  Assessment:  1. Hypertension  Plan: Continue current meds. Labs obtained today for CPX. He wishes to return and have CPX with a male provider asap since Dr. Cato Mulligan schedule is booked through November for CPX.

## 2013-07-14 ENCOUNTER — Other Ambulatory Visit: Payer: Self-pay | Admitting: Internal Medicine

## 2013-07-23 ENCOUNTER — Encounter: Payer: 59 | Admitting: Family

## 2013-07-23 DIAGNOSIS — Z0289 Encounter for other administrative examinations: Secondary | ICD-10-CM

## 2013-08-17 ENCOUNTER — Other Ambulatory Visit: Payer: Self-pay | Admitting: Internal Medicine

## 2013-08-31 ENCOUNTER — Telehealth: Payer: Self-pay | Admitting: Internal Medicine

## 2013-08-31 NOTE — Telephone Encounter (Signed)
Patient Information:  Caller Name: Kamare  Phone: [phone]  Patient: Jeffrey Garcia, Jeffrey Garcia  Gender: Male  DOB: 08-13-1966  Age: 47 Years  PCP: Birdie Sons (Adults only)  Office Follow Up:  Does the office need to follow up with this patient?: No  Instructions For The Office: N/A   Symptoms  Reason For Call & Symptoms: Headache since 08/30/13 and today BP = 157/97. Normally 120/90. No other sx. Takes Lisinopril 10 mgs 1 PO at noon daily. He has not missed any doses. He has not taken any pain reliever. Triage and Care advice per Headache and Hypertension Protocols and advised for him to call back for appointment since he has not tried pain relievers and may have trouble coming in on 09/01/13 d/t work schedule.  Reviewed Health History In EMR: Yes  Reviewed Medications In EMR: Yes  Reviewed Allergies In EMR: Yes  Reviewed Surgeries / Procedures: Yes  Date of Onset of Symptoms: 08/30/2013  Guideline(s) Used:  Headache  High Blood Pressure  Disposition Per Guideline:   See Today or Tomorrow in Office  Reason For Disposition Reached:   Unexplained headache that is present > 24 hours  Advice Given:  Pain Medicines:  For pain relief, you can take either acetaminophen, ibuprofen, or naproxen.  They are over-the-counter (OTC) pain drugs. You can buy them at the drugstore.  Rest:   Lie down in a dark, quiet place and try to relax. Close your eyes and imagine your entire body relaxing.  Apply Cold to the Area:   Apply a cold wet washcloth or cold pack to the forehead for 20 minutes.  Stretching:   Stretch and massage any tight neck muscles.  Call Back If:  Headache lasts longer than 24 hours  You become worse.  General:  Untreated high blood pressure may cause damage to the heart, brain, kidneys, and eyes.  Treatment of high blood pressure can reduce the risk of stroke, heart attack, and heart failure.  The goal of blood pressure treatment for most patients with hypertension is to keep  the blood pressure under 140/90.  Reassurance - Migraine Headache:  You have told me that this headache is similar to previous migraine headaches that you have had. If the pattern or severity of your headache changes, you will need to see your physician.  Pain Medicines:  For pain relief, you can take either acetaminophen, ibuprofen, or naproxen.  They are over-the-counter (OTC) pain drugs. You can buy them at the drugstore.  Rest:   Lie down in a dark, quiet place and try to relax. Close your eyes and imagine your entire body relaxing.  Apply Cold to the Area:   Apply a cold wet washcloth or cold pack to the forehead for 20 minutes.  Call Back If:  Headache lasts longer than 24 hours  You become worse.  Patient Refused Recommendation:  Patient Will Make Own Appointment  Will call back to schedule.

## 2013-09-03 ENCOUNTER — Ambulatory Visit: Payer: 59 | Admitting: Internal Medicine

## 2013-09-06 ENCOUNTER — Encounter: Payer: Self-pay | Admitting: Family

## 2013-09-06 ENCOUNTER — Ambulatory Visit (INDEPENDENT_AMBULATORY_CARE_PROVIDER_SITE_OTHER): Payer: 59 | Admitting: Family

## 2013-09-06 VITALS — BP 140/92 | HR 57 | Temp 98.2°F | Wt 133.0 lb

## 2013-09-06 DIAGNOSIS — I10 Essential (primary) hypertension: Secondary | ICD-10-CM

## 2013-09-06 DIAGNOSIS — R51 Headache: Secondary | ICD-10-CM

## 2013-09-06 DIAGNOSIS — R7989 Other specified abnormal findings of blood chemistry: Secondary | ICD-10-CM

## 2013-09-06 MED ORDER — LISINOPRIL 20 MG PO TABS: 20.0000 mg | ORAL_TABLET | Freq: Every day | ORAL | Status: DC

## 2013-09-06 NOTE — Patient Instructions (Signed)
Managing Your High Blood Pressure Blood pressure is a measurement of how forceful your blood is pressing against the walls of the arteries. Arteries are muscular tubes within the circulatory system. Blood pressure does not stay the same. Blood pressure rises when you are active, excited, or nervous; and it lowers during sleep and relaxation. If the numbers measuring your blood pressure stay above normal most of the time, you are at risk for health problems. High blood pressure (hypertension) is a long-term (chronic) condition in which blood pressure is elevated. A blood pressure reading is recorded as two numbers, such as 120 over 80 (or 120/80). The first, higher number is called the systolic pressure. It is a measure of the pressure in your arteries as the heart beats. The second, lower number is called the diastolic pressure. It is a measure of the pressure in your arteries as the heart relaxes between beats.  Keeping your blood pressure in a normal range is important to your overall health and prevention of health problems, such as heart disease and stroke. When your blood pressure is uncontrolled, your heart has to work harder than normal. High blood pressure is a very common condition in adults because blood pressure tends to rise with age. Men and women are equally likely to have hypertension but at different times in life. Before age 45, men are more likely to have hypertension. After 49 years of age, women are more likely to have it. Hypertension is especially common in African Americans. This condition often has no signs or symptoms. The cause of the condition is usually not known. Your caregiver can help you come up with a plan to keep your blood pressure in a normal, healthy range. BLOOD PRESSURE STAGES Blood pressure is classified into four stages: normal, prehypertension, stage 1, and stage 2. Your blood pressure reading will be used to determine what type of treatment, if any, is necessary.  Appropriate treatment options are tied to these four stages:  Normal  Systolic pressure (mm Hg): below 120.  Diastolic pressure (mm Hg): below 80. Prehypertension  Systolic pressure (mm Hg): 120 to 139.  Diastolic pressure (mm Hg): 80 to 89. Stage1  Systolic pressure (mm Hg): 140 to 159.  Diastolic pressure (mm Hg): 90 to 99. Stage2  Systolic pressure (mm Hg): 160 or above.  Diastolic pressure (mm Hg): 100 or above. RISKS RELATED TO HIGH BLOOD PRESSURE Managing your blood pressure is an important responsibility. Uncontrolled high blood pressure can lead to:  A heart attack.  A stroke.  A weakened blood vessel (aneurysm).  Heart failure.  Kidney damage.  Eye damage.  Metabolic syndrome.  Memory and concentration problems. HOW TO MANAGE YOUR BLOOD PRESSURE Blood pressure can be managed effectively with lifestyle changes and medicines (if needed). Your caregiver will help you come up with a plan to bring your blood pressure within a normal range. Your plan should include the following: Education  Read all information provided by your caregivers about how to control blood pressure.  Educate yourself on the latest guidelines and treatment recommendations. New research is always being done to further define the risks and treatments for high blood pressure. Lifestylechanges  Control your weight.  Avoid smoking.  Stay physically active.  Reduce the amount of salt in your diet.  Reduce stress.  Control any chronic conditions, such as high cholesterol or diabetes.  Reduce your alcohol intake. Medicines  Several medicines (antihypertensive medicines) are available, if needed, to bring blood pressure within a normal range.   Reduce stress.   Control any chronic conditions, such as high cholesterol or diabetes.   Reduce your alcohol intake.  Medicines   Several medicines (antihypertensive medicines) are available, if needed, to bring blood pressure within a normal range.  Communication   Review all the medicines you take with your caregiver because there may be side effects or interactions.   Talk with your caregiver about your diet, exercise habits, and other lifestyle factors that may be contributing to  high blood pressure.   See your caregiver regularly. Your caregiver can help you create and adjust your plan for managing high blood pressure.  RECOMMENDATIONS FOR TREATMENT AND FOLLOW-UP   The following recommendations are based on current guidelines for managing high blood pressure in nonpregnant adults. Use these recommendations to identify the proper follow-up period or treatment option based on your blood pressure reading. You can discuss these options with your caregiver.   Systolic pressure of 120 to 139 or diastolic pressure of 80 to 89: Follow up with your caregiver as directed.   Systolic pressure of 140 to 160 or diastolic pressure of 90 to 100: Follow up with your caregiver within 2 months.   Systolic pressure above 160 or diastolic pressure above 100: Follow up with your caregiver within 1 month.   Systolic pressure above 180 or diastolic pressure above 110: Consider antihypertensive therapy; follow up with your caregiver within 1 week.   Systolic pressure above 200 or diastolic pressure above 120: Begin antihypertensive therapy; follow up with your caregiver within 1 week.  Document Released: 08/05/2012 Document Reviewed: 08/05/2012  [employer] Patient Information 2014 [employer]

## 2013-09-06 NOTE — Progress Notes (Signed)
  Subjective:    Patient ID: Jeffrey Garcia, male    DOB: 27-Jun-1966, 47 y.o.   MRN: [id number]  HPI 47 year old male, nonsmoker, patient of Dr. Cato Mulligan in today with complaints of a headache x2 days last week that has since resolved. He believes that the headaches may have been related to an elevated blood pressure. He doesn't check his blood pressure, with readings between 145-155/90-100. He currently takes lisinopril 10 mg once daily. Reports that he has recently stopped exercising. For which he was exercising daily. He denies any lightheadedness, dizziness, chest pain, palpitations, shortness of breath or edema.  Review of Systems  Constitutional: Negative.   Respiratory: Negative.   Cardiovascular: Negative.   Endocrine: Negative.   Musculoskeletal: Negative.   Skin: Negative.   Allergic/Immunologic: Negative.   Neurological: Positive for headaches.  Hematological: Negative.   Psychiatric/Behavioral: Negative.        Objective:   Physical Exam  Constitutional: He is oriented to person, place, and time. He appears well-developed and well-nourished.  HENT:  Right Ear: External ear normal.  Left Ear: External ear normal.  Nose: Nose normal.  Mouth/Throat: Oropharynx is clear and moist.  Neck: Normal range of motion. Neck supple.  Cardiovascular: Normal rate, regular rhythm and normal heart sounds.   Pulmonary/Chest: Effort normal and breath sounds normal.  Abdominal: Soft. Bowel sounds are normal.  Musculoskeletal: Normal range of motion.  Neurological: He is alert and oriented to person, place, and time. He has normal reflexes.  Skin: Skin is warm and dry.  Psychiatric: He has a normal mood and affect.          Assessment & Plan:   assessment: 1. Hypertension-uncontrolled 2. Headache  Plan: Increase lisinopril to 20 mg once daily. Tylenol as needed for headache. Patient to call the office with any questions or concerns. Recheck in 2 weeks and sooner as needed.

## 2013-09-27 ENCOUNTER — Ambulatory Visit: Payer: 59 | Admitting: Family

## 2013-09-27 DIAGNOSIS — Z0289 Encounter for other administrative examinations: Secondary | ICD-10-CM

## 2013-12-13 ENCOUNTER — Telehealth: Payer: Self-pay | Admitting: Internal Medicine

## 2013-12-13 NOTE — Telephone Encounter (Signed)
Pt has mail order rx coverage now with [facility].  He has called [facility] and was advised his rx for Lisinopril 20mg  has not been escribed yet.  He is almost out.  Please fax an rx and he questions if he can get a month's rx sent to [facility].  Please call.

## 2013-12-14 MED ORDER — LISINOPRIL 20 MG PO TABS: 20.0000 mg | ORAL_TABLET | Freq: Every day | ORAL | Status: AC

## 2013-12-14 MED ORDER — LISINOPRIL 20 MG PO TABS: 20.0000 mg | ORAL_TABLET | Freq: Every day | ORAL | Status: DC

## 2013-12-14 NOTE — Telephone Encounter (Signed)
30 day supply sent in to [facility], 90 days sent in to [facility]

## 2014-03-24 ENCOUNTER — Emergency Department (HOSPITAL_COMMUNITY)
Admission: EM | Admit: 2014-03-24 | Discharge: 2014-03-25 | Disposition: E | Payer: 59 | Attending: Emergency Medicine | Admitting: Emergency Medicine

## 2014-03-24 ENCOUNTER — Emergency Department (HOSPITAL_COMMUNITY): Payer: 59

## 2014-03-24 ENCOUNTER — Ambulatory Visit (HOSPITAL_COMMUNITY): Admit: 2014-03-24 | Payer: Self-pay | Admitting: Interventional Cardiology

## 2014-03-24 ENCOUNTER — Encounter (HOSPITAL_COMMUNITY): Admission: EM | Disposition: E | Payer: Self-pay | Source: Home / Self Care | Attending: Emergency Medicine

## 2014-03-24 DIAGNOSIS — I10 Essential (primary) hypertension: Secondary | ICD-10-CM

## 2014-03-24 DIAGNOSIS — R0681 Apnea, not elsewhere classified: Secondary | ICD-10-CM | POA: Insufficient documentation

## 2014-03-24 DIAGNOSIS — I469 Cardiac arrest, cause unspecified: Secondary | ICD-10-CM | POA: Insufficient documentation

## 2014-03-24 DIAGNOSIS — R143 Flatulence: Secondary | ICD-10-CM

## 2014-03-24 DIAGNOSIS — Z87442 Personal history of urinary calculi: Secondary | ICD-10-CM | POA: Insufficient documentation

## 2014-03-24 DIAGNOSIS — R142 Eructation: Secondary | ICD-10-CM

## 2014-03-24 DIAGNOSIS — R141 Gas pain: Secondary | ICD-10-CM | POA: Insufficient documentation

## 2014-03-24 DIAGNOSIS — R404 Transient alteration of awareness: Secondary | ICD-10-CM | POA: Insufficient documentation

## 2014-03-24 DIAGNOSIS — I2119 ST elevation (STEMI) myocardial infarction involving other coronary artery of inferior wall: Secondary | ICD-10-CM | POA: Insufficient documentation

## 2014-03-24 LAB — I-STAT ARTERIAL BLOOD GAS, ED
Acid-base deficit: 30 mmol/L — ABNORMAL HIGH (ref 0.0–2.0)
Bicarbonate: 8.7 mEq/L — ABNORMAL LOW (ref 20.0–24.0)
O2 Saturation: 69 %
PCO2 ART: 97.8 mmHg — AB (ref 35.0–45.0)
PH ART: 6.558 — AB (ref 7.350–7.450)
Patient temperature: 98.7
TCO2: 12 mmol/L (ref 0–100)
pO2, Arterial: 87 mmHg (ref 80.0–100.0)

## 2014-03-24 LAB — BASIC METABOLIC PANEL
BUN: 13 mg/dL (ref 6–23)
CHLORIDE: 103 meq/L (ref 96–112)
CO2: 12 meq/L — AB (ref 19–32)
Calcium: 9 mg/dL (ref 8.4–10.5)
Creatinine, Ser: 1.24 mg/dL (ref 0.50–1.35)
GFR calc Af Amer: 77 mL/min — ABNORMAL LOW (ref >90)
GFR calc non Af Amer: 66 mL/min — ABNORMAL LOW (ref >90)
Glucose, Bld: 313 mg/dL — ABNORMAL HIGH (ref 70–99)
POTASSIUM: 4.2 meq/L (ref 3.7–5.3)
Sodium: 147 mEq/L (ref 137–147)

## 2014-03-24 LAB — CBC
HEMATOCRIT: 45.4 % (ref 39.0–52.0)
HEMOGLOBIN: 14.5 g/dL (ref 13.0–17.0)
MCH: 29.1 pg (ref 26.0–34.0)
MCHC: 31.9 g/dL (ref 30.0–36.0)
MCV: 91.2 fL (ref 78.0–100.0)
Platelets: 133 10*3/uL — ABNORMAL LOW (ref 150–400)
RBC: 4.98 MIL/uL (ref 4.22–5.81)
RDW: 12.6 % (ref 11.5–15.5)
WBC: 9.5 10*3/uL (ref 4.0–10.5)

## 2014-03-24 LAB — I-STAT CHEM 8, ED
BUN: 15 mg/dL (ref 6–23)
Calcium, Ion: 1.15 mmol/L (ref 1.12–1.23)
Chloride: 109 mEq/L (ref 96–112)
Creatinine, Ser: 1.4 mg/dL — ABNORMAL HIGH (ref 0.50–1.35)
GLUCOSE: 302 mg/dL — AB (ref 70–99)
HCT: 46 % (ref 39.0–52.0)
Hemoglobin: 15.6 g/dL (ref 13.0–17.0)
Potassium: 3.9 mEq/L (ref 3.7–5.3)
Sodium: 143 mEq/L (ref 137–147)
TCO2: 16 mmol/L (ref 0–100)

## 2014-03-24 LAB — I-STAT TROPONIN, ED: TROPONIN I, POC: 0.12 ng/mL — AB (ref 0.00–0.08)

## 2014-03-24 LAB — PROTIME-INR
INR: 1.68 — AB (ref 0.00–1.49)
PROTHROMBIN TIME: 19.3 s — AB (ref 11.6–15.2)

## 2014-03-24 LAB — CBG MONITORING, ED: Glucose-Capillary: 290 mg/dL — ABNORMAL HIGH (ref 70–99)

## 2014-03-24 LAB — APTT: aPTT: 58 seconds — ABNORMAL HIGH (ref 24–37)

## 2014-03-24 SURGERY — LEFT HEART CATHETERIZATION WITH CORONARY ANGIOGRAM
Anesthesia: Choice | Laterality: Bilateral

## 2014-03-24 MED ORDER — ETOMIDATE 2 MG/ML IV SOLN
INTRAVENOUS | Status: AC
  Filled 2014-03-24: qty 20

## 2014-03-24 MED ORDER — DEXTROSE 5 % IV SOLN
0.5000 ug/min | INTRAVENOUS | Status: DC
  Filled 2014-03-24: qty 4

## 2014-03-24 MED ORDER — HEPARIN (PORCINE) IN NACL 100-0.45 UNIT/ML-% IJ SOLN
450.0000 [IU]/h | INTRAMUSCULAR | Status: DC
  Filled 2014-03-24: qty 250

## 2014-03-24 MED ORDER — FENTANYL CITRATE 0.05 MG/ML IJ SOLN: 100.0000 ug | Freq: Once | INTRAMUSCULAR | Status: DC

## 2014-03-24 MED ORDER — EPINEPHRINE HCL 0.1 MG/ML IJ SOSY
PREFILLED_SYRINGE | INTRAMUSCULAR | Status: AC | PRN
  Administered 2014-03-24 (×2): 1 via INTRAVENOUS

## 2014-03-24 MED ORDER — SUCCINYLCHOLINE CHLORIDE 20 MG/ML IJ SOLN
INTRAMUSCULAR | Status: AC
  Filled 2014-03-24: qty 1

## 2014-03-24 MED ORDER — LIDOCAINE HCL (CARDIAC) 20 MG/ML IV SOLN
INTRAVENOUS | Status: AC
  Filled 2014-03-24: qty 5

## 2014-03-24 MED ORDER — DEXTROSE 5 % IV SOLN
0.5000 ug/min | INTRAVENOUS | Status: DC
  Filled 2014-03-24: qty 1

## 2014-03-24 MED ORDER — SUCCINYLCHOLINE CHLORIDE 20 MG/ML IJ SOLN
INTRAMUSCULAR | Status: AC | PRN
  Administered 2014-03-24: 125 mg via INTRAVENOUS

## 2014-03-24 MED ORDER — SODIUM CHLORIDE 0.9 % IV SOLN
25.0000 ug/h | INTRAVENOUS | Status: DC
  Filled 2014-03-24: qty 50

## 2014-03-24 MED ORDER — FENTANYL BOLUS VIA INFUSION
50.0000 ug | INTRAVENOUS | Status: DC | PRN
  Filled 2014-03-24: qty 50

## 2014-03-24 MED ORDER — CISATRACURIUM BESYLATE 10 MG/ML IV SOLN
1.0000 ug/kg/min | INTRAVENOUS | Status: DC
  Filled 2014-03-24: qty 20

## 2014-03-24 MED ORDER — SODIUM CHLORIDE 0.9 % IV SOLN: 2000.0000 mL | Freq: Once | INTRAVENOUS | Status: DC

## 2014-03-24 MED ORDER — CISATRACURIUM BOLUS VIA INFUSION
0.1000 mg/kg | Freq: Once | INTRAVENOUS | Status: DC
  Filled 2014-03-24: qty 6

## 2014-03-24 MED ORDER — FENTANYL CITRATE 0.05 MG/ML IJ SOLN: 100.0000 ug | Freq: Once | INTRAMUSCULAR | Status: DC | PRN

## 2014-03-24 MED ORDER — SODIUM BICARBONATE 8.4 % IV SOLN
INTRAVENOUS | Status: AC | PRN
  Administered 2014-03-24: 50 meq via INTRAVENOUS

## 2014-03-24 MED ORDER — ATROPINE SULFATE 0.1 MG/ML IJ SOLN
INTRAMUSCULAR | Status: AC
  Filled 2014-03-24: qty 10

## 2014-03-24 MED ORDER — HEPARIN BOLUS VIA INFUSION
1800.0000 [IU] | Freq: Once | INTRAVENOUS | Status: DC
  Filled 2014-03-24: qty 1800

## 2014-03-24 MED ORDER — HEPARIN SODIUM (PORCINE) 5000 UNIT/ML IJ SOLN
INTRAMUSCULAR | Status: AC
  Administered 2014-03-24: 4000 [IU]
  Filled 2014-03-24: qty 1

## 2014-03-24 MED ORDER — CISATRACURIUM BOLUS VIA INFUSION
0.0500 mg/kg | INTRAVENOUS | Status: DC | PRN
  Filled 2014-03-24: qty 3

## 2014-03-24 MED ORDER — ASPIRIN 300 MG RE SUPP: 300.0000 mg | RECTAL | Status: DC

## 2014-03-24 MED ORDER — SODIUM CHLORIDE 0.9 % IV SOLN
INTRAVENOUS | Status: DC
  Administered 2014-03-24: 19:00:00 via INTRAVENOUS
  Filled 2014-03-24: qty 1000

## 2014-03-24 MED ORDER — PROPOFOL 10 MG/ML IV EMUL: 5.0000 ug/kg/min | INTRAVENOUS | Status: DC

## 2014-03-24 MED ORDER — ATROPINE SULFATE 1 MG/ML IJ SOLN
INTRAMUSCULAR | Status: AC | PRN
  Administered 2014-03-24 (×4): 1 mg via INTRAVENOUS

## 2014-03-24 MED ORDER — ROCURONIUM BROMIDE 50 MG/5ML IV SOLN
INTRAVENOUS | Status: AC
  Filled 2014-03-24: qty 2

## 2014-03-25 MED FILL — Medication: Qty: 1 | Status: AC

## 2014-03-25 NOTE — ED Notes (Signed)
CPR stopped.  Weak femoral pulses present.

## 2014-03-25 NOTE — ED Notes (Signed)
Decision made to cool pt.  Pharmacy notified.

## 2014-03-25 NOTE — Code Documentation (Signed)
Temperature foley placed by courtney, emt
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
## 2014-03-25 NOTE — ED Notes (Signed)
HR decreased to 30's.  CPR restarted.

## 2014-03-25 NOTE — Code Documentation (Signed)
Pulse present

## 2014-03-25 NOTE — ED Notes (Signed)
Dr. Smith at bedside.

## 2014-03-25 NOTE — ED Notes (Signed)
Notified CARELINK to Activate Code Stemi

## 2014-03-25 NOTE — ED Notes (Signed)
Patient transferred to c26 for pt family.

## 2014-03-25 NOTE — Code Documentation (Signed)
Pulses lost. CPR restarted 

## 2014-03-25 NOTE — Code Documentation (Signed)
Compressions resumed

## 2014-03-25 NOTE — Code Documentation (Signed)
Patient time of death occurred at 1931. 
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
## 2014-03-25 NOTE — Code Documentation (Signed)
cpr stopped.  No pulses present.  PEA on monitor.

## 2014-03-25 NOTE — ED Notes (Signed)
Spoke with Martiniqu[place]olina donor - pt needs to be placed back on vent - respiratory notified. Tv 10-12 cc/kg  Peep 5 Fio2 100%.

## 2014-03-25 NOTE — Code Documentation (Signed)
CPR stopped. Pulses present.
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
## 2014-03-25 NOTE — ED Provider Notes (Signed)
CSN: [id number]     Arrival date & time 01/13/14  1849 History   First MD Initiated Contact with Patient 002/19/15 1937     Chief Complaint  Patient presents with  . cpr    Level V caveat: Cardiac arrest  (Consider location/radiation/quality/duration/timing/severity/associated sxs/prior Treatment) The history is provided by the EMS personnel.   history of present illness: 48 year old male with history of hypertension who presented via EMS status post cardiac arrest. Patient reportedly called for a bike ride when he came home he complained of chest pain. Proximal and 45 minutes later he was found unresponsive by his family and CPR was initiated. On EMS arrival patient was found to be pulseless in PEA. No shockable rhythm identified during code. CPR was continued in route. Patient given multiple doses of epinephrine. King airway was placed.  Past Medical History  Diagnosis Date  . Hypertension   . History of nephrolithiasis    Past Surgical History  Procedure Laterality Date  . Vasectomy     Family History  Problem Relation Age of Onset  . Stroke Father    History  Substance Use Topics  . Smoking status: Never Smoker   . Smokeless tobacco: Not on file  . Alcohol Use: Yes    Review of Systems  Unable to perform ROS: Intubated      Allergies  Review of patient's allergies indicates no known allergies.  Home Medications   Prior to Admission medications   Medication Sig Start Date End Date Taking? Authorizing Provider  lisinopril (PRINIVIL,ZESTRIL) 20 MG tablet Take 1 tablet (20 mg total) by mouth daily. 12/14/13   Lindley MagnusBruce H Swords, MD   BP 124/76  Pulse 62  Resp 16  Wt 132 lb (59.875 kg)  SpO2 100% Physical Exam  Nursing note and vitals reviewed. Constitutional: He appears well-developed and well-nourished. He appears distressed. He is intubated.  HENT:  Head: Normocephalic and atraumatic.  Eyes: Conjunctivae are normal. Right pupil is not reactive. Left pupil is not  reactive.  Neck: Neck supple.  Cardiovascular: Exam reveals decreased pulses.   Pulses:      Carotid pulses are 0 on the right side, and 0 on the left side.      Femoral pulses are 0 on the right side, and 0 on the left side. Pulmonary/Chest: Apnea noted. He is intubated. He is in respiratory distress.  Abdominal: Soft. He exhibits distension.  Musculoskeletal: He exhibits no edema.  Neurological: He is unresponsive. GCS eye subscore is 1. GCS verbal subscore is 1. GCS motor subscore is 1.  Skin: Skin is warm and dry.    ED Course  INTUBATION Date/Time: 03/25/2014 12:54 AM Performed by: Cherre RobinsBEAVER, Daelen Belvedere Authorized by: Cherre RobinsBEAVER, Jeanet Lupe Consent: The procedure was performed in an emergent situation. Indications: respiratory failure Intubation method: direct Patient status: unconscious Laryngoscope size: Mac 4 Tube size: 7.5 mm Tube type: cuffed Number of attempts: 1 Cricoid pressure: yes Cords visualized: yes Post-procedure assessment: chest rise,  ETCO2 monitor and CO2 detector Breath sounds: equal Cuff inflated: yes ETT to lip: 23 cm Tube secured with: ETT holder   (including critical care time) Labs Review Labs Reviewed  CBC - Abnormal; Notable for the following:    Platelets 133 (*)    All other components within normal limits  APTT - Abnormal; Notable for the following:    aPTT 58 (*)    All other components within normal limits  BASIC METABOLIC PANEL - Abnormal; Notable for the following:    CO2  12 (*)    Glucose, Bld 313 (*)    GFR calc non Af Amer 66 (*)    GFR calc Af Amer 77 (*)    All other components within normal limits  PROTIME-INR - Abnormal; Notable for the following:    Prothrombin Time 19.3 (*)    INR 1.68 (*)    All other components within normal limits  CBG MONITORING, ED - Abnormal; Notable for the following:    Glucose-Capillary 290 (*)    All other components within normal limits  I-STAT CHEM 8, ED - Abnormal; Notable for the following:     Creatinine, Ser 1.40 (*)    Glucose, Bld 302 (*)    All other components within normal limits  I-STAT TROPOININ, ED - Abnormal; Notable for the following:    Troponin i, poc 0.12 (*)    All other components within normal limits  I-STAT ARTERIAL BLOOD GAS, ED - Abnormal; Notable for the following:    pH, Arterial 6.558 (*)    pCO2 arterial 97.8 (*)    Bicarbonate 8.7 (*)    Acid-base deficit 30.0 (*)    All other components within normal limits  BLOOD GAS, ARTERIAL  I-STAT CHEM 8, ED    Imaging Review No results found.   EKG Interpretation None      MDM   Final diagnoses:  Acute MI, inferolateral wall  Cardiac arrest  Essential hypertension, benign    48 year old male with history of hypertension who presented via EMS with CPR in progress. The patient PEA with a slow rhythm in the 20s to 30s throughout EMS code. He received multiple doses of epinephrine in route a King airway was placed. On arrival patient continued to be in PEA arrest with heart rate in the 20s to 30s. He was given 1 mg of atropine and had return of spontaneous circulation.  King airway was exchanged for an ET tube and patient intubated as documented above.  Patient unresponsive and hypothermic protocol was initially started.  After ROSC EKG obtained that was consistent with a posterior MI. Code STEMI was called and cardiology attending presented to the bedside. Patient given heparin bolus.  Patient then had 3 episodes of PEA arrest and achieved ROSC after the first 2 with atropine. Patient was given multiple doses of atropine and epinephrine. He was also started on an epinephrine drip. Please see nursing notes of the code for full details.  After third episode of arrest CPR was continued until patient was found to be in asystole. Minimal, ineffective cardiac activity noted on bedside ultrasound. Input was sought from the team as to any other potential life-saving interventions and none were found.  Resuscitative efforts were stopped. Time of death [id number].  Medical examiner was consult in and stated that this would not be and in each case.  Cherre RobinsBryan Imajean Mcdermid, MD 03/25/14 [phone]

## 2014-03-25 NOTE — Progress Notes (Signed)
Chaplain responded to stemi and subsequent death in ED. Chaplain stayed with pt's wife and provided emotional support and prayer while she waited for family to arrive. Pt's wife very vocal in her distress and tearful. Pt's son and daughter, as well as a close family friend arrived, and doctor notified them of pt's death. Chaplain provided grief support and compassionate presence to family as they grieved. Chaplain obtained next of kin information for RN and gave family patient placement card.

## 2014-03-25 NOTE — Code Documentation (Signed)
No pulse.  Compressions started. 

## 2014-03-25 DEATH — deceased

## 2014-04-25 NOTE — ED Provider Notes (Signed)
I saw and evaluated the patient, reviewed the resident's note and I agree with the findings and plan.   EKG Interpretation None      I agree with the interpretation of the EKG above. I saw this patient with Dr. Christain SacramentoBeaver on the patient's arrival. I agree with his assessment and charting. Please see my accompanying note.  Rolland PorterMark Kailea Dannemiller, MD 04/01/14 [phone]
